# Patient Record
Sex: Male | Born: 1965 | Race: White | Hispanic: No | Marital: Married | Smoking: Former smoker
Health system: Southern US, Community
[De-identification: ages and names within clinical notes are randomized; demographics above are authoritative.]

## PROBLEM LIST (undated history)

## (undated) DIAGNOSIS — G709 Myoneural disorder, unspecified: Secondary | ICD-10-CM

## (undated) DIAGNOSIS — K760 Fatty (change of) liver, not elsewhere classified: Secondary | ICD-10-CM

## (undated) DIAGNOSIS — M109 Gout, unspecified: Secondary | ICD-10-CM

## (undated) DIAGNOSIS — E291 Testicular hypofunction: Secondary | ICD-10-CM

## (undated) DIAGNOSIS — E669 Obesity, unspecified: Secondary | ICD-10-CM

## (undated) DIAGNOSIS — E781 Pure hyperglyceridemia: Secondary | ICD-10-CM

## (undated) HISTORY — DX: Gout, unspecified: M10.9

## (undated) HISTORY — DX: Obesity, unspecified: E66.9

## (undated) HISTORY — PX: SPINE SURGERY: SHX786

## (undated) HISTORY — PX: OTHER SURGICAL HISTORY: SHX169

## (undated) HISTORY — DX: Fatty (change of) liver, not elsewhere classified: K76.0

## (undated) HISTORY — DX: Pure hyperglyceridemia: E78.1

## (undated) HISTORY — DX: Testicular hypofunction: E29.1

## (undated) HISTORY — DX: Myoneural disorder, unspecified: G70.9

---

## 2003-01-19 ENCOUNTER — Encounter: Payer: Self-pay | Admitting: Emergency Medicine

## 2003-01-19 ENCOUNTER — Emergency Department (HOSPITAL_COMMUNITY): Admission: EM | Admit: 2003-01-19 | Discharge: 2003-01-19 | Payer: Self-pay | Admitting: Emergency Medicine

## 2003-04-16 ENCOUNTER — Ambulatory Visit (HOSPITAL_COMMUNITY): Admission: RE | Admit: 2003-04-16 | Discharge: 2003-04-16 | Payer: Self-pay | Admitting: Neurosurgery

## 2003-05-09 ENCOUNTER — Ambulatory Visit (HOSPITAL_COMMUNITY): Admission: RE | Admit: 2003-05-09 | Discharge: 2003-05-10 | Payer: Self-pay | Admitting: Neurosurgery

## 2003-05-09 ENCOUNTER — Encounter: Payer: Self-pay | Admitting: Neurosurgery

## 2003-12-18 ENCOUNTER — Encounter (HOSPITAL_COMMUNITY): Admission: RE | Admit: 2003-12-18 | Discharge: 2004-01-17 | Payer: Self-pay | Admitting: Neurosurgery

## 2005-07-26 ENCOUNTER — Ambulatory Visit (HOSPITAL_COMMUNITY): Admission: RE | Admit: 2005-07-26 | Discharge: 2005-07-26 | Payer: Self-pay | Admitting: Gastroenterology

## 2005-07-26 ENCOUNTER — Encounter (INDEPENDENT_AMBULATORY_CARE_PROVIDER_SITE_OTHER): Payer: Self-pay | Admitting: *Deleted

## 2005-11-04 ENCOUNTER — Encounter: Admission: RE | Admit: 2005-11-04 | Discharge: 2005-11-04 | Payer: Self-pay | Admitting: Orthopedic Surgery

## 2007-11-12 ENCOUNTER — Inpatient Hospital Stay (HOSPITAL_COMMUNITY): Admission: RE | Admit: 2007-11-12 | Discharge: 2007-11-14 | Payer: Self-pay | Admitting: Neurosurgery

## 2009-05-23 IMAGING — CR DG CERVICAL SPINE 2 OR 3 VIEWS
1 series · 1 of 1 positions shown · non-contrast
Comparison: none

CLINICAL DATA: 41-year-old with numbness in left arm.
 CERVICAL SPINE - 2 VIEW:

[view not recorded]
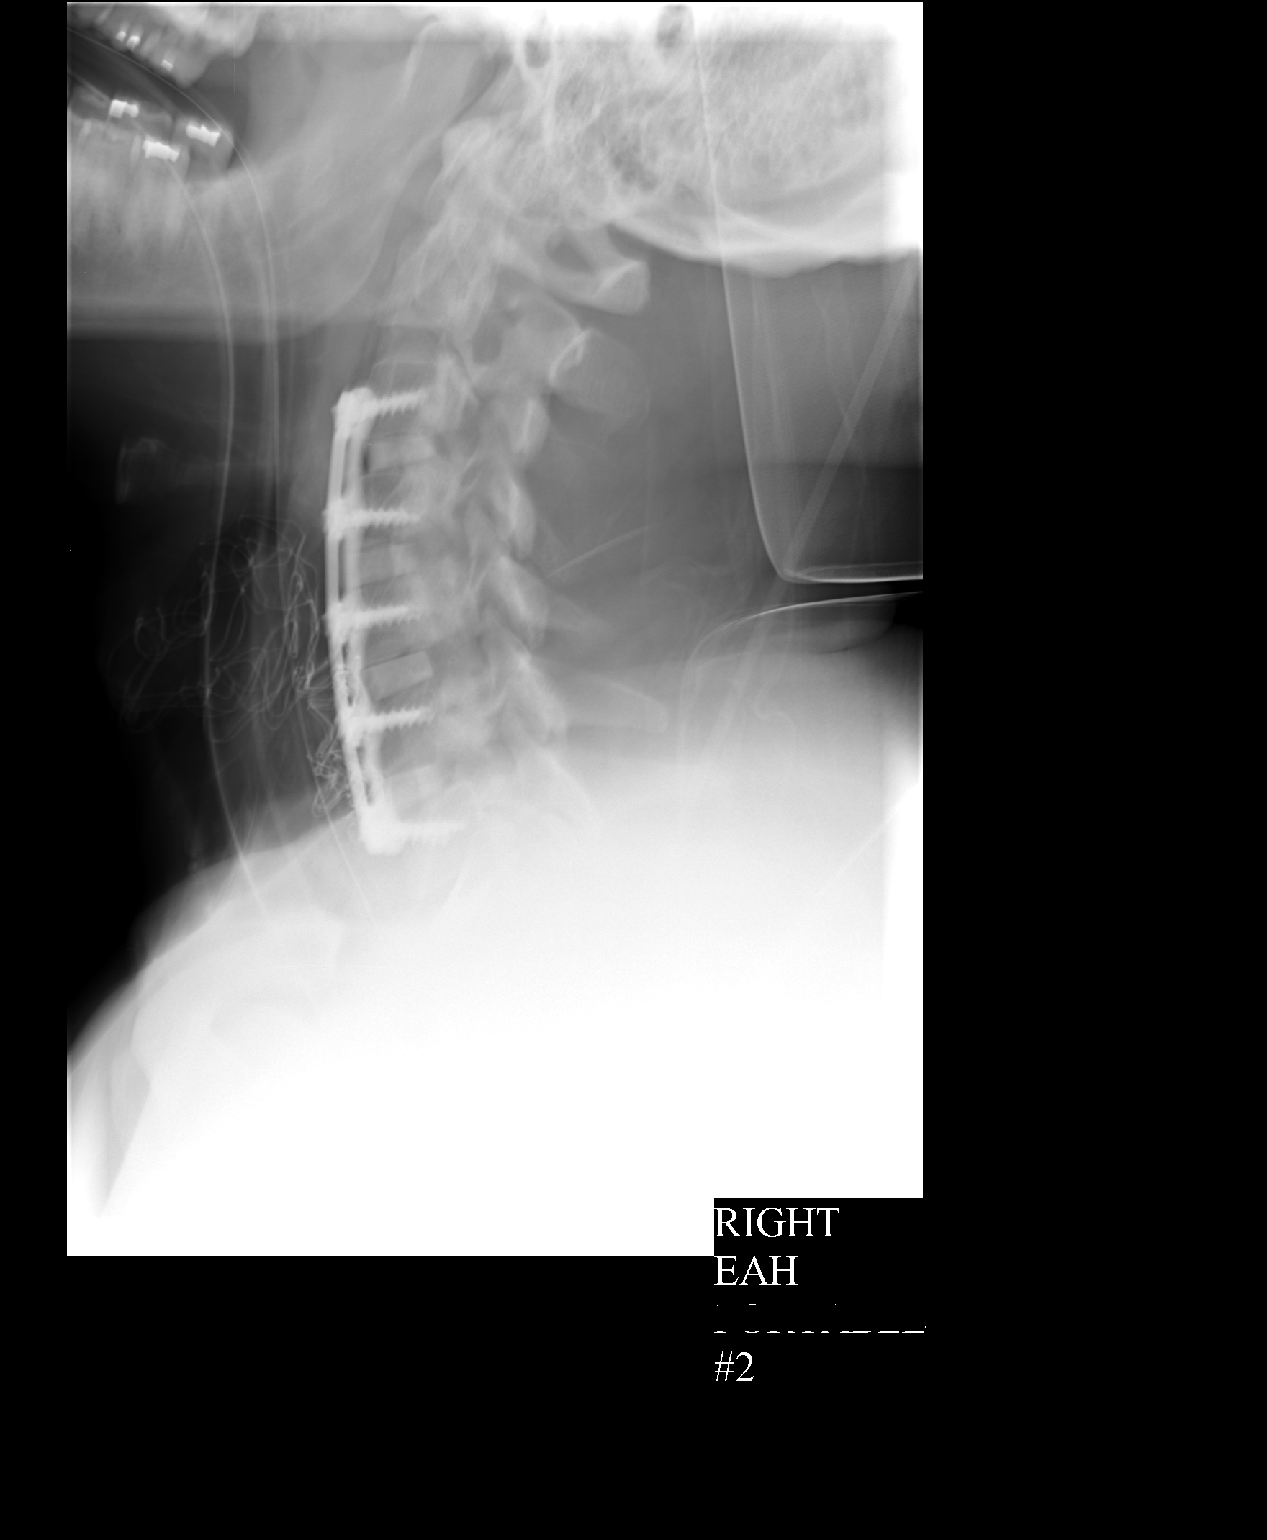

[1 of 1 positions shown; findings below may reference images not displayed]

FINDINGS: Two lateral cervical spine films from the operating room. The first film demonstrates a spinal needle projecting at the C4-5 interspace. The second film demonstrates anterior plate and screws and interbody bone plug fusing C3 to C7. Normal alignment. No complicating features.
IMPRESSION: C3-C7 anterior and interbody fusion.

## 2009-05-23 IMAGING — CR DG CERVICAL SPINE 2 OR 3 VIEWS
1 series · 1 of 1 positions shown · non-contrast
Comparison: none

CLINICAL DATA: 41-year-old with numbness in left arm.
 CERVICAL SPINE - 2 VIEW:

[view not recorded]
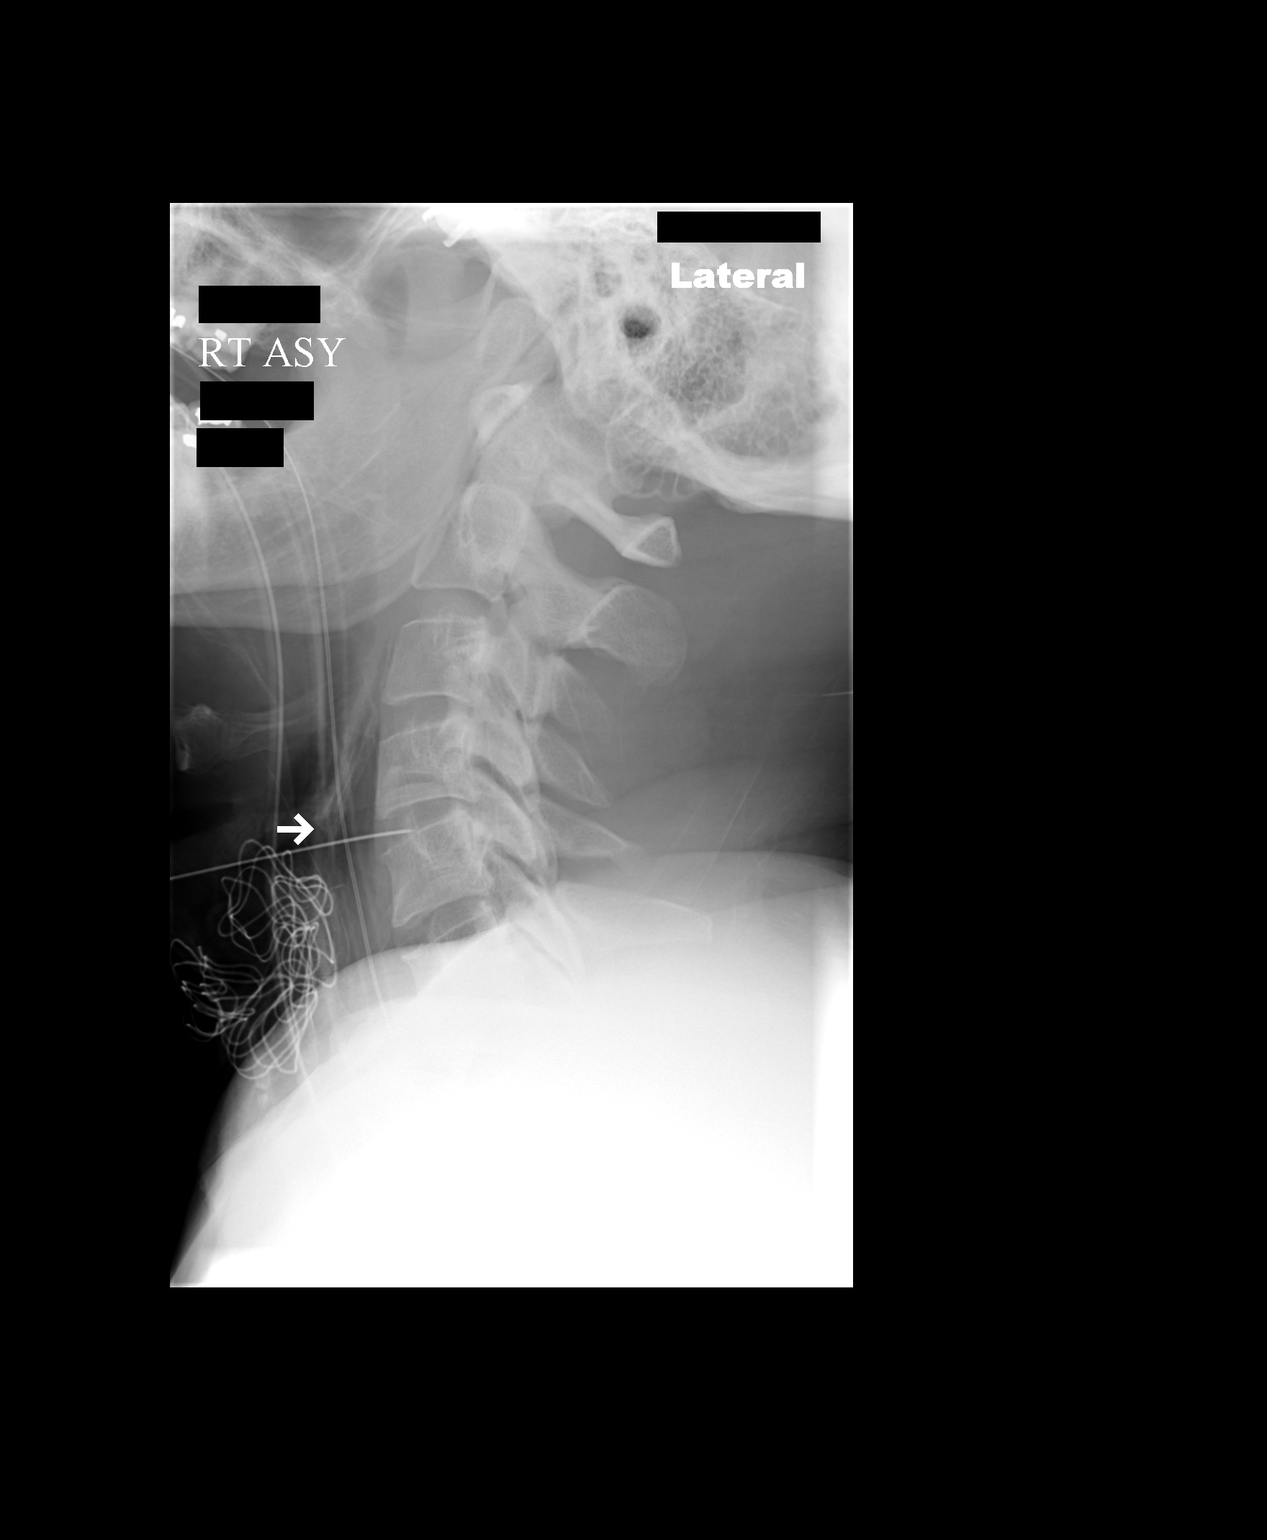

[1 of 1 positions shown; findings below may reference images not displayed]

FINDINGS: Two lateral cervical spine films from the operating room. The first film demonstrates a spinal needle projecting at the C4-5 interspace. The second film demonstrates anterior plate and screws and interbody bone plug fusing C3 to C7. Normal alignment. No complicating features.
IMPRESSION: C3-C7 anterior and interbody fusion.

## 2011-02-22 NOTE — Consult Note (Signed)
Victor Murray, SINDELAR NO.:  [id number]   MEDICAL RECORD NO.:  [id number]          PATIENT TYPE:  AMB   LOCATION:  SDS                          FACILITY:  [facility]   PHYSICIAN:  Hilda Lias, M.D.   DATE OF BIRTH:  1966-09-23   DATE OF CONSULTATION:  11/12/2007  DATE OF DISCHARGE:                                 CONSULTATION   HISTORY OF PRESENT ILLNESS:  Victor Murray is a gentleman who was in my  office last week as emergency because of weakness of both upper  extremity associated with a burning pain.  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  He was seen in __________ [facility].  Because I did surgery on  his back many years ago, he called me and I saw him in my office late  last week.  Including clinically, he has a burning sensation in both  upper extremity with weakness of the deltoid and biceps.  We did an MRI  which showed degenerative disk disease with stenosis and spinal cord  changes, worse at the C4-5, C6-7, but also really compromise at the  level of C3-4 and C6-7.  Because two __________ were really bad and the  other were really abnormal, he wanted to go ahead and do mostly the  decompression of the level of C4-5, C5-6, thinking that he can go back  to fighting in [place] in 2 weeks.  I was skeptical about all of  this, and he said that over the weekend he talked to his company, and I  think that they will not take him after having neck effusion.  Because  of that, I talked to him at length today.  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  Nevertheless, that is not going to be  possible.  Because of that, we change the strategy, and he wanted to  proceed with the following decompression which in deed that is what  eventually he will be needing.  Then the procedure today was changes to  C3-4, C4-5,  C5-6, C6-7 decompression with fusion.  The risk of  __________ was fully explained to him, and they do not change it  whatsoever in relation to the one we were doing before.           ______________________________  Hilda Lias, M.D.     EB/MEDQ  D:  11/12/2007  T:  11/12/2007  Job:  [id number]

## 2011-02-22 NOTE — Op Note (Signed)
Victor Murray, POKE NO.:  [id number]   MEDICAL RECORD NO.:  [id number]          PATIENT TYPE:  OIB   LOCATION:  3172                         FACILITY:  [facility]   PHYSICIAN:  Hilda Lias, M.D.   DATE OF BIRTH:  04/24/1966   DATE OF PROCEDURE:  11/12/2007  DATE OF DISCHARGE:                               OPERATIVE REPORT   PREOPERATIVE DIAGNOSES:  1. Cervical stenosis with cervical myelopathy.  2. 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   POSTOPERATIVE DIAGNOSES:  1. Cervical stenosis with cervical myelopathy.  2. 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   PROCEDURE:  1. Anterior C3-4, C4-5, C5-6 and C6-7 diskectomy.  2. Decompression of the spinal cord.  3. Interbody fusion with allograft and autograft plate from V3-X1,      microscope.   </   SURGEON:  Hilda Lias, M.D.   ASSISTANT:  Cristi Loron, M.D.   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  He was in the battlefield constantly.  He  came to [facility] for vacation, and he was complaining of some burning  sensation at the level of the left anterior chest wall.  In the past I  did surgery in the lumbar area on this gentleman, and I saw him in my  office as an emergency.  We did an MRI, which showed severe spondylosis  from C3 down to C7.  The worst was at the level of C4-5 and C5-6, where  he already had some changes within the spinal cord itself.  Because the  patient wanted to go back to [place] around November 30, 2007, we  agreed to proceed with the two levels of C4-5 and C5-6 (which were the  worst ones); although he knew that the disks above and below might  require surgery later on because of the compromise.  Nevertheless, the  stenosis was worse at those two levels.   This morning, upon talking to him and after he reconsidered with his  wife; they decided that he does not want  to take any chances, and we are  going to procedure with all levels.  The surgery was fully explained to  him in my office, including the possibility of paralysis, failure of the  plate, need for further surgery, infection with stroke, and no  improvement whatsoever.   PROCEDURE:  The patient was taken to the OR and after intubation, which  was done in a neutral position, the neck was cleaned with DuraPrep.  A  longitudinal incision was made through the skin and subcutaneous tissue.  X-rays showed that we were at the level of C4-5.  Because C4-5 was the  worst one, we started at this level.  There were osteophytes anteriorly,  which were removed.  With the microscope we opened the anterior ligament  of C4-5, and we did remove the disk which was quite degenerative.  We  opened the posterior ligament and there was some herniated disk going  bilaterally.  The spinal cord was  flat.  With the 1 mm, 2 and 3 mm  Kerrison punch we removed the posterior ligament, with decompression of  the spinal cord as well as foraminotomy.   Then, the next level was C5/6, which was the worst one.  Here is where  we found that there was adhesion between posterior ligament and the dura  matter.  We had to use a microdissector to be able to find a plane, and  at the end we were able to decompress the C6 nerve root and the spinal  cord.  At the level of C6-7 we found a central disk with bilateral  compromise, the right worse than the left one, up to the point that  there was quite a bit adhesion between the foramen and the C7 nerve root  on the right side.  At the level of C3-C4, we found mostly spondylosis  with bilateral compromise, right worse than the left.  Then we went  ahead and drilled the endplate.  Then full bone graft 1-6 mm with  allograft with bone autograft in the middle was inserted.  This was  between C3 and C4, and the rest of 7 mm height was between C4-5, C5-6  and C6-7.  Then a plate using  titanium screws was applied to keep the  area stable.  Lateral cervical spine showed good position of the plate  and the bone graft.  Although we had good hemostasis, because of the  magnitude of the surgery we left the catheter in the paracervical area.  Then the wound was closed with Vicryl and Steri-Strips.           ______________________________  Hilda Lias, M.D.     EB/MEDQ  D:  11/12/2007  T:  11/12/2007  Job:  [id number]

## 2011-02-22 NOTE — Discharge Summary (Signed)
NAMEKYCE, GING NO.:  [id number]   MEDICAL RECORD NO.:  [id number]          PATIENT TYPE:  INP   LOCATION:  3006                         FACILITY:  [facility]   PHYSICIAN:  Hilda Lias, M.D.   DATE OF BIRTH:  [birth year]   DATE OF ADMISSION:  11/12/2007  DATE OF DISCHARGE:  11/14/2007                               DISCHARGE SUMMARY   ADMISSION DIAGNOSIS:  Cervical stenosis with early myelopathy C3 to C7.   DISCHARGE DIAGNOSIS:  Cervical stenosis with early myelopathy C3 to C7.   CLINICAL HISTORY:  The patient was admitted because of neck pain with  weakness, numbness, and tingling sensation in both hands.  X-ray showed  severe stenosis from C3 down to C7.  Surgery was advised.  Laboratory  normal.   The patient was taken to surgery and anterior decompression from C3 down  to C7 with a fusion using bone graft and plate was done.  Today he is  doing much better.  He is walking.  He has no discomfort whatsoever.  He  feels that the numbness is much better.  He is doing so well up to the  point that he left the hospital to smoke.  He has no difficulty  swallowing.  He is going to be discharged.   CONDITION ON DISCHARGE:  Improved.   DISCHARGE MEDICATIONS:  1. Percocet.  2. Diazepam.   DIET:  Regular.  He was advised to avoid big pieces of meat.   ACTIVITY:  No driving for at least a week.   FOLLOWUP:  He will be seen by me in 3-4 weeks.  Any questions or  swelling, he is to call my office any time.           ______________________________  Hilda Lias, M.D.     EB/MEDQ  D:  11/14/2007  T:  11/15/2007  Job:  [id number]

## 2011-02-25 NOTE — Op Note (Signed)
Victor Murray, Victor Murray                         ACCOUNT NO.:  [id number]   MEDICAL RECORD NO.:  [id number]                   PATIENT TYPE:  OIB   LOCATION:  3013                                 FACILITY:  [facility]   PHYSICIAN:  Hilda Lias, M.D.                DATE OF BIRTH:  1966-03-11   DATE OF PROCEDURE:  05/09/2003  DATE OF DISCHARGE:                                 OPERATIVE REPORT   PREOPERATIVE DIAGNOSIS:  Right L3-4 herniated disk.   POSTOPERATIVE DIAGNOSIS:  Right L3-4 herniated disk.   PROCEDURE:  Right L3-4 diskectomy, decompression of the L3 nerve root,  microscope.   SURGEON:  Hilda Lias, M.D.   ASSISTANT:  Coletta Memos, M.D.   CLINICAL HISTORY:  The patient was admitted because of back and left leg  pain.  The patient had weakness of the quadriceps and the iliopsoas.  The  patient was scheduled for surgery a few weeks ago, but he decided that he  was doing better and he declined surgery.  Then he came back later on to my  office with worsening of the pain.  Surgery was advised all over again, and  he decided that indeed that was the best way for him.  He just bought a new  house, and he was advised about being careful about lifting because he might  have a recurrence.  The risks were explained in the history and physical.   DESCRIPTION OF PROCEDURE:  The patient was taken to the OR, and he was  positioned in a prone manner.  The back was prepped with Betadine.  A  midline incision from L3 to L4 was made.  X-rays showed that indeed we were  at that area.  We brought the microscope into the area, and we drilled the  lower lamina of L3 and the upper of L4.  A thick yellow ligament was also  excised.  We identified the thecal sac.  Indeed, there was quite a bit of  tightness in that area.  Retraction was made, and the L4 nerve root was  anchored to the space.  Lysis was achieved.  Indeed, there was also ligament  and disk going into the body of L3.  Removal was  made.  There was some  calcification of that area.  We entered the disk space and large amounts of  degenerated disk medial and lateral were removed.  At the end we had plenty  of room with plenty of decompression for the L3 and L4 nerve root.  Valsalva  maneuver was negative.  From then on the area was irrigated.  Fentanyl and  Depo-Medrol were left in the epidural space, and the wound was closed with  Vicryl and a Steri-Strip.  Hilda Lias, M.D.    EB/MEDQ  D:  05/09/2003  T:  05/09/2003  Job:  [id number]

## 2011-02-25 NOTE — Discharge Summary (Signed)
   NAME:  Victor Murray, Victor Murray                         ACCOUNT NO.:  [id number]   MEDICAL RECORD NO.:  [id number]                   PATIENT TYPE:  OIB   LOCATION:  2853                                 FACILITY:  [facility]   PHYSICIAN:  Hilda Lias, M.D.                DATE OF BIRTH:  Dec 07, 1965   DATE OF ADMISSION:  04/16/2003  DATE OF DISCHARGE:  04/16/2003                                 DISCHARGE SUMMARY   SUMMARY:  [name] is a 45 year old gentleman who was admitted today  because of back and left leg pain.  MRI showed that he has a huge herniated  disk.  Surgery was advised because of no improvement.  The patient, when he  came to see me half an hour before surgery, was telling me that he was  feeling better.  I advised him that if he is feeling better we should not  start surgery because we do not like to do surgery on people who are better.  I explained to him that we do surgery on people who are getting worse,  unless they have a tumor.  I talked to him and his wife.  The patient  debated between going ahead and not going ahead, but again I told him that  one of the principals of surgery is the mental preparation for the person to  go ahead with surgery.  I do think that he has quite doubts about indeed  requiring surgery and I told him that it would be better for him to go home,  see how he feels, become more active, and if indeed the pain gets worse to  give me a call; I would be more than happy to reschedule him.                                               Hilda Lias, M.D.    EB/MEDQ  D:  04/16/2003  T:  04/17/2003  Job:  [id number]

## 2011-02-25 NOTE — H&P (Signed)
NAME:  Victor Murray, Victor Murray                         ACCOUNT NO.:  [id number]   MEDICAL RECORD NO.:  [id number]                   PATIENT TYPE:  OIB   LOCATION:  NA                                   FACILITY:  [facility]   PHYSICIAN:  Hilda Lias, M.D.                DATE OF BIRTH:  07-08-1966   DATE OF ADMISSION:  05/09/2003  DATE OF DISCHARGE:                                HISTORY & PHYSICAL   HISTORY OF PRESENT ILLNESS:  This patient is a gentleman who was seen by me  in my office about two weeks ago complaining of back pain with radiation  down to the left knee mostly going to the left thigh.  The patient was  cleared to have surgery a few days ago but he decided to cancel because he  felt he was doing better.  Nevertheless, he came back soon after to my  office telling me that he was not any better, he was getting worse with more  pain down into the left leg.  He was quite miserable.  Now he has decided he  wants to go on with the surgery because he is out of work and notes that his  pain is getting worse.  He denies any problems with his right leg.   PAST MEDICAL HISTORY:  Prior knee arthroscopy.   ALLERGIES:  The patient is not allergic to any medications.   SOCIAL HISTORY:  The patient smokes and drinks.   FAMILY HISTORY:  Unremarkable.   REVIEW OF SYMPTOMS:  Positive for back pain and radiation down into the left  leg.   PHYSICAL EXAMINATION:  HEENT:  Head, nose and throat normal.  NECK:  Normal.  LUNGS:  Clear.  HEART:  Sounds are normal.  ABDOMEN:  Normal.  EXTREMITIES:  Normal pulses.  NEUROLOGICAL:  Normal.  Cranial nerves II-XII normal.  Strength is 5/5 on  examination but __________ the left iliopsoas and left abductor.  Reflexes  are symmetrical.  Absent of both knee jerks but present at ankle.  His  straight leg raising is negative.  The femoral stretch maneuver is positive  on the left side.   CLINICAL DATA:  The MRI shows that he has a herniated disc at level  L3-L4  with a fragment going up to the __________ vertebrae.  There is a small  bulging disc at L4-L5.   IMPRESSION:  Left L3-L4 herniated disc with fragment.   PLAN:  The patient is going to be admitted for surgery.  The procedure will  be left L3-L4 diskectomy.  At the beginning we were going to do procedure  with the C-arm__________, but because of the condition and we worry  about the fibrosis around the nerve root, we are going to a standard surgery  using the microscope.  This surgery was explained to him and his wife in my  office with the risks such as  no improvement whatsoever, worsening of pain,  paralysis, CSF leak, need for further surgery.                                                Hilda Lias, M.D.    EB/MEDQ  D:  05/09/2003  T:  05/09/2003  Job:  [id number]

## 2011-07-01 LAB — CBC
HCT: 45.5
Hemoglobin: 15.9
MCHC: 34.9
MCV: 93.1
RBC: 4.88
RDW: 13.1

## 2015-06-08 ENCOUNTER — Ambulatory Visit (INDEPENDENT_AMBULATORY_CARE_PROVIDER_SITE_OTHER): Payer: No Typology Code available for payment source | Admitting: Family Medicine

## 2015-06-08 ENCOUNTER — Encounter: Payer: Self-pay | Admitting: Family Medicine

## 2015-06-08 VITALS — BP 104/68 | HR 72 | Temp 98.2°F | Resp 18 | Ht 70.5 in | Wt 237.0 lb

## 2015-06-08 DIAGNOSIS — K760 Fatty (change of) liver, not elsewhere classified: Secondary | ICD-10-CM

## 2015-06-08 DIAGNOSIS — E781 Pure hyperglyceridemia: Secondary | ICD-10-CM | POA: Insufficient documentation

## 2015-06-08 DIAGNOSIS — Z7189 Other specified counseling: Secondary | ICD-10-CM | POA: Diagnosis not present

## 2015-06-08 DIAGNOSIS — E785 Hyperlipidemia, unspecified: Secondary | ICD-10-CM

## 2015-06-08 DIAGNOSIS — Z7689 Persons encountering health services in other specified circumstances: Secondary | ICD-10-CM

## 2015-06-08 DIAGNOSIS — E291 Testicular hypofunction: Secondary | ICD-10-CM | POA: Insufficient documentation

## 2015-06-08 DIAGNOSIS — E669 Obesity, unspecified: Secondary | ICD-10-CM | POA: Insufficient documentation

## 2015-06-08 DIAGNOSIS — M1000 Idiopathic gout, unspecified site: Secondary | ICD-10-CM

## 2015-06-08 DIAGNOSIS — M109 Gout, unspecified: Secondary | ICD-10-CM | POA: Insufficient documentation

## 2015-06-08 NOTE — Progress Notes (Signed)
Subjective:    Patient ID: Victor Murray, male    DOB: 09/27/1966, 49 y.o.   MRN: [id number]  HPI   patient is here today to establish care. He was injured in 2009 while serving in [place]. He injured his neck. He underwent ACDF of 4 cervical vertebrae by Dr. Jeral Fruit.   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 They have been managed with Gralise and diclofenac.   Patient is still trying to work. He wants to become more active. Recently and lab work he was found to have a testosterone level significantly depressed 203, elevated liver function test with an AST of 51 and an ALT of 115, triglycerides of 572, and HDL of 20. He was told that his uric acid level was 3 times the upper limits of normal. He was started on 600 mg a day of allopurinol. He has not had a uric acid level checked since that time. He also had an ultrasound of his liver obtained given the elevated liver function test which showed an enlarged fatty liver. He is here today to establish care. Past Medical History  Diagnosis Date  . Hypertriglyceridemia   . Hypogonadism in male   . Fatty liver disease, nonalcoholic   . Obesity    Past Surgical History  Procedure Laterality Date  . Cervical fusion      Dr. Jeral Fruit    Current outpatient prescriptions:  .  allopurinol (ZYLOPRIM) 300 MG tablet, TK 1 T PO BID, Disp: , Rfl: 11 .  colchicine 0.6 MG tablet, TK 1 T PO Q 2 H PRF SEVERE GOUT ATTACK. MAX OF 6 TS PER DAY, Disp: , Rfl: 0 .  diazepam (VALIUM) 10 MG tablet, TK 1 T PO TID PRN, Disp: , Rfl: 4 .  GRALISE 300 MG TABS, TK 1 T PO QD - Along with 600 mg qhs = 1500 mg qhs, Disp: , Rfl: 4 .  GRALISE 600 MG TABS, TK 2 TS PO QHS - Along with 1 300 mg tanblet =1500mg , Disp: , Rfl: 4 .  magnesium 30 MG tablet, Take 30 mg by mouth 2 (two) times daily., Disp: , Rfl:  .  Potassium Gluconate 550 MG TABS, Take by mouth., Disp: , Rfl:  .  Vitamin D, Ergocalciferol,  (DRISDOL) 50000 UNITS CAPS capsule, TK 1 C PO 2 TIMES A WEEK, Disp: , Rfl: 3 .  ZORVOLEX 35 MG CAPS, TK 1 C PO TID, Disp: , Rfl: 4 No Known Allergies Social History   Social History  . Marital Status: Legally Separated    Spouse Name: N/A  . Number of Children: N/A  . Years of Education: N/A   Occupational History  . Not on file.   Social History Main Topics  . Smoking status: Former Smoker    Quit date: 10/10/2010  . Smokeless tobacco: Not on file  . Alcohol Use: No  . Drug Use: No  . Sexual Activity: Not on file   Other Topics Concern  . Not on file   Social History Narrative  . No narrative on file   Family History  Problem Relation Age of Onset  . Cancer Mother 5    unknown primary  . Diabetes Father       Review of Systems  All other systems reviewed and are negative.      Objective:   Physical Exam  Constitutional: He is oriented to person, place,  and time. He appears well-developed and well-nourished. No distress.  HENT:  Head: Normocephalic and atraumatic.  Right Ear: External ear normal.  Left Ear: External ear normal.  Nose: Nose normal.  Mouth/Throat: Oropharynx is clear and moist. No oropharyngeal exudate.  Eyes: Conjunctivae and EOM are normal. Pupils are equal, round, and reactive to light. Right eye exhibits no discharge. Left eye exhibits no discharge. No scleral icterus.  Neck: Normal range of motion. Neck supple. No JVD present. No thyromegaly present.  Cardiovascular: Normal rate, regular rhythm and normal heart sounds.   No murmur heard. Pulmonary/Chest: Effort normal and breath sounds normal. No respiratory distress. He has no wheezes. He has no rales. He exhibits no tenderness.  Abdominal: Soft. Bowel sounds are normal. He exhibits no distension. There is no tenderness. There is no rebound and no guarding.  Musculoskeletal: He exhibits no edema.  Lymphadenopathy:    He has no cervical adenopathy.  Neurological: He is alert and  oriented to person, place, and time. He has normal reflexes. He displays normal reflexes. No cranial nerve deficit. He exhibits normal muscle tone. Coordination normal.  Skin: Skin is warm. No rash noted. He is not diaphoretic. No erythema. No pallor.  Psychiatric: He has a normal mood and affect. His behavior is normal. Judgment and thought content normal.  Vitals reviewed.         Assessment & Plan:  Establishing care with new doctor, encounter for   I would like the patient to return fasting for a CBC, CMP, uric acid level, fasting lipid panel , and a PSA. His testosterone is being treated by his pain management doctor. He will be due for repeat digital rectal exam and PSA in December. If his uric acid level is not less than 6, I would recommend switching him off the allopurinol due to the risk of liver toxicity and switching him to uloric.   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 Also believe he has fatty liver disease secondary to his dyslipidemia. If this is confirmed on a fasting lipid panel as his other lipid panel was not fasting, I will start the patient on fenofibrate 160 mg by mouth daily. He is not due for colonoscopy until age 46

## 2015-06-09 ENCOUNTER — Ambulatory Visit: Payer: Self-pay | Admitting: Adult Health

## 2015-06-11 ENCOUNTER — Encounter: Payer: Self-pay | Admitting: *Deleted

## 2015-06-12 ENCOUNTER — Other Ambulatory Visit: Payer: No Typology Code available for payment source

## 2015-06-12 LAB — COMPLETE METABOLIC PANEL WITH GFR
ALBUMIN: 4.2 g/dL (ref 3.6–5.1)
ALT: 67 U/L — AB (ref 9–46)
AST: 40 U/L (ref 10–40)
Alkaline Phosphatase: 79 U/L (ref 40–115)
BUN: 18 mg/dL (ref 7–25)
CALCIUM: 9.5 mg/dL (ref 8.6–10.3)
CHLORIDE: 105 mmol/L (ref 98–110)
CO2: 27 mmol/L (ref 20–31)
CREATININE: 0.98 mg/dL (ref 0.60–1.35)
GFR, Est African American: >89 mL/min (ref >=60)
GFR, Est Non African American: >89 mL/min (ref >=60)
GLUCOSE: 96 mg/dL (ref 70–99)
POTASSIUM: 4.6 mmol/L (ref 3.5–5.3)
SODIUM: 139 mmol/L (ref 135–146)
Total Bilirubin: 0.5 mg/dL (ref 0.2–1.2)
Total Protein: 6.8 g/dL (ref 6.1–8.1)

## 2015-06-12 LAB — LIPID PANEL
CHOLESTEROL: 145 mg/dL (ref 125–200)
HDL: 22 mg/dL — ABNORMAL LOW (ref >=40)
LDL Cholesterol: 79 mg/dL (ref <130)
Total CHOL/HDL Ratio: 6.6 Ratio — ABNORMAL HIGH (ref <=5.0)
Triglycerides: 222 mg/dL — ABNORMAL HIGH (ref <150)
VLDL: 44 mg/dL — ABNORMAL HIGH (ref <30)

## 2015-06-12 LAB — CBC WITH DIFFERENTIAL/PLATELET
Basophils Absolute: 0.1 10*3/uL (ref 0.0–0.1)
Basophils Relative: 1 % (ref 0–1)
EOS PCT: 4 % (ref 0–5)
Eosinophils Absolute: 0.2 10*3/uL (ref 0.0–0.7)
HEMATOCRIT: 41.5 % (ref 39.0–52.0)
HEMOGLOBIN: 14.1 g/dL (ref 13.0–17.0)
LYMPHS PCT: 51 % — AB (ref 12–46)
Lymphs Abs: 3 10*3/uL (ref 0.7–4.0)
MCH: 30.1 pg (ref 26.0–34.0)
MCHC: 34 g/dL (ref 30.0–36.0)
MCV: 88.7 fL (ref 78.0–100.0)
MONO ABS: 0.5 10*3/uL (ref 0.1–1.0)
MONOS PCT: 8 % (ref 3–12)
MPV: 10.5 fL (ref 8.6–12.4)
NEUTROS ABS: 2.1 10*3/uL (ref 1.7–7.7)
Neutrophils Relative %: 36 % — ABNORMAL LOW (ref 43–77)
Platelets: 257 10*3/uL (ref 150–400)
RBC: 4.68 MIL/uL (ref 4.22–5.81)
RDW: 16.3 % — AB (ref 11.5–15.5)
WBC: 5.9 10*3/uL (ref 4.0–10.5)

## 2015-06-12 LAB — URIC ACID: URIC ACID, SERUM: 4.1 mg/dL (ref 4.0–7.8)

## 2015-06-13 LAB — PSA: PSA: 0.65 ng/mL (ref <=4.00)

## 2015-06-18 ENCOUNTER — Other Ambulatory Visit: Payer: Self-pay | Admitting: Family Medicine

## 2015-06-18 MED ORDER — FENOFIBRATE 160 MG PO TABS: 160.0000 mg | ORAL_TABLET | Freq: Every day | ORAL | Status: DC

## 2015-09-18 ENCOUNTER — Ambulatory Visit (INDEPENDENT_AMBULATORY_CARE_PROVIDER_SITE_OTHER): Payer: No Typology Code available for payment source | Admitting: Family Medicine

## 2015-09-18 ENCOUNTER — Encounter: Payer: Self-pay | Admitting: Family Medicine

## 2015-09-18 VITALS — BP 130/78 | HR 80 | Temp 98.4°F | Resp 20 | Ht 70.5 in | Wt 244.0 lb

## 2015-09-18 DIAGNOSIS — E785 Hyperlipidemia, unspecified: Secondary | ICD-10-CM | POA: Diagnosis not present

## 2015-09-18 LAB — LIPID PANEL
CHOL/HDL RATIO: 6.7 ratio — AB (ref <=5.0)
CHOLESTEROL: 174 mg/dL (ref 125–200)
HDL: 26 mg/dL — AB (ref >=40)
LDL Cholesterol: 111 mg/dL (ref <130)
Triglycerides: 184 mg/dL — ABNORMAL HIGH (ref <150)
VLDL: 37 mg/dL — AB (ref <30)

## 2015-09-18 LAB — COMPLETE METABOLIC PANEL WITH GFR
ALK PHOS: 63 U/L (ref 40–115)
ALT: 34 U/L (ref 9–46)
AST: 27 U/L (ref 10–40)
Albumin: 4.2 g/dL (ref 3.6–5.1)
BUN: 21 mg/dL (ref 7–25)
CHLORIDE: 100 mmol/L (ref 98–110)
CO2: 25 mmol/L (ref 20–31)
Calcium: 9.4 mg/dL (ref 8.6–10.3)
Creat: 1.19 mg/dL (ref 0.60–1.35)
GFR, EST AFRICAN AMERICAN: 82 mL/min (ref >=60)
GFR, Est Non African American: 71 mL/min (ref >=60)
GLUCOSE: 96 mg/dL (ref 70–99)
Potassium: 4.6 mmol/L (ref 3.5–5.3)
SODIUM: 137 mmol/L (ref 135–146)
Total Bilirubin: 0.6 mg/dL (ref 0.2–1.2)
Total Protein: 7.4 g/dL (ref 6.1–8.1)

## 2015-09-18 NOTE — Progress Notes (Signed)
Subjective:    Patient ID: Victor Murray, male    DOB: 10/02/1966, 49 y.o.   MRN: [id number]  HPI  06/08/15  patient is here today to establish care. He was injured in 2009 while serving in [place]. He injured his neck. He underwent ACDF of 4 cervical vertebrae by Dr. Jeral Fruit.   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 They have been managed with Gralise and diclofenac.   Patient is still trying to work. He wants to become more active. Recently and lab work he was found to have a testosterone level significantly depressed 203, elevated liver function test with an AST of 51 and an ALT of 115, triglycerides of 572, and HDL of 20. He was told that his uric acid level was 3 times the upper limits of normal. He was started on 600 mg a day of allopurinol. He has not had a uric acid level checked since that time. He also had an ultrasound of his liver obtained given the elevated liver function test which showed an enlarged fatty liver. He is here today to establish care.  At that time, my plan was: I would like the patient to return fasting for a CBC, CMP, uric acid level, fasting lipid panel , and a PSA. His testosterone is being treated by his pain management doctor. He will be due for repeat digital rectal exam and PSA in December. If his uric acid level is not less than 6, I would recommend switching him off the allopurinol due to the risk of liver toxicity and switching him to uloric.   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 Also believe he has fatty liver disease secondary to his dyslipidemia. If this is confirmed on a fasting lipid panel as his other lipid panel was not fasting, I will start the patient on fenofibrate 160 mg by mouth daily. He is not due for colonoscopy until age 54  09/18/15 Patient is here today for follow-up of his dyslipidemia. He is currently taking fenofibrate 160 mg by mouth daily. He  denies any right upper quadrant pain. He denies any new myalgias although he does have significant body pain secondary to his service related injuries. He also has an underlying history of fatty liver disease. He is here today to monitor a CMP to watch for worsening of his fatty liver disease, to evaluate for any signs of liver irritation secondary to the fenofibrate, and also monitor his dyslipidemia. He does report pain in his feet. It is worse in the right foot although it is also present in the left foot. He describes it as a generalized ache. The pain seems to be located at approximately the fourth MTP joint primarily on the plantar aspect of both feet. There are no palpable deformities in this area. There is no swelling although he is tender to palpation in that area. His uric acid level was excellent at his last blood work at 4.1 and he is in steady state and therefore I do not believe this is likely gout. I would be concerned about possible arthritis as a potential cause. He is already taking diclofenac 35 mg by mouth 3 times a day Past Medical History  Diagnosis Date  . Hypertriglyceridemia   . Hypogonadism in male   . Fatty liver disease, nonalcoholic   . Obesity   . Gout   . Neuromuscular disorder (HCC)  Past Surgical History  Procedure Laterality Date  . Cervical fusion      Dr. Jeral Fruit  . Spine surgery      Current outpatient prescriptions:  .  allopurinol (ZYLOPRIM) 300 MG tablet, TK 1 T PO BID, Disp: , Rfl: 11 .  colchicine 0.6 MG tablet, TK 1 T PO Q 2 H PRF SEVERE GOUT ATTACK. MAX OF 6 TS PER DAY, Disp: , Rfl: 0 .  diazepam (VALIUM) 10 MG tablet, TK 1 T PO TID PRN, Disp: , Rfl: 4 .  fenofibrate 160 MG tablet, Take 1 tablet (160 mg total) by mouth daily., Disp: 90 tablet, Rfl: 3 .  GRALISE 300 MG TABS, TK 1 T PO QD - Along with 600 mg qhs = 1500 mg qhs, Disp: , Rfl: 4 .  GRALISE 600 MG TABS, TK 2 TS PO QHS - Along with 1 300 mg tanblet =1500mg , Disp: , Rfl: 4 .  magnesium 30  MG tablet, Take 1,200 mg by mouth at bedtime. , Disp: , Rfl:  .  Potassium Gluconate 550 MG TABS, Take by mouth., Disp: , Rfl:  .  Vitamin D, Ergocalciferol, (DRISDOL) 50000 UNITS CAPS capsule, TK 1 C PO 2 TIMES A WEEK, Disp: , Rfl: 3 .  ZORVOLEX 35 MG CAPS, TK 1 C PO TID  (Diclofenac), Disp: , Rfl: 4 No Known Allergies Social History   Social History  . Marital Status: Legally Separated    Spouse Name: N/A  . Number of Children: N/A  . Years of Education: N/A   Occupational History  . Not on file.   Social History Main Topics  . Smoking status: Former Smoker    Quit date: 10/10/2010  . Smokeless tobacco: Never Used  . Alcohol Use: No  . Drug Use: No  . Sexual Activity: Not Currently   Other Topics Concern  . Not on file   Social History Narrative   Family History  Problem Relation Age of Onset  . Cancer Mother 75    unknown primary  . Early death Mother   . Diabetes Father   . Hearing loss Father   . Diabetes Brother   . Alcohol abuse Paternal Grandfather       Review of Systems  All other systems reviewed and are negative.      Objective:   Physical Exam  Constitutional: He is oriented to person, place, and time. He appears well-developed and well-nourished. No distress.  HENT:  Head: Normocephalic and atraumatic.  Right Ear: External ear normal.  Left Ear: External ear normal.  Nose: Nose normal.  Mouth/Throat: Oropharynx is clear and moist. No oropharyngeal exudate.  Eyes: Conjunctivae and EOM are normal. Pupils are equal, round, and reactive to light. Right eye exhibits no discharge. Left eye exhibits no discharge. No scleral icterus.  Neck: Normal range of motion. Neck supple. No JVD present. No thyromegaly present.  Cardiovascular: Normal rate, regular rhythm and normal heart sounds.   No murmur heard. Pulmonary/Chest: Effort normal and breath sounds normal. No respiratory distress. He has no wheezes. He has no rales. He exhibits no tenderness.    Abdominal: Soft. Bowel sounds are normal. He exhibits no distension. There is no tenderness. There is no rebound and no guarding.  Musculoskeletal: He exhibits no edema.  Lymphadenopathy:    He has no cervical adenopathy.  Neurological: He is alert and oriented to person, place, and time. He has normal reflexes. No cranial nerve deficit. He exhibits normal muscle tone. Coordination normal.  Skin: Skin is warm. No rash noted. He is not diaphoretic. No erythema. No pallor.  Psychiatric: He has a normal mood and affect. His behavior is normal. Judgment and thought content normal.  Vitals reviewed.         Assessment & Plan:  Dyslipidemia - Plan: COMPLETE METABOLIC PANEL WITH GFR, Lipid panel check a fasting lipid panel to monitor his triglycerides as well as his HDL cholesterol. I will also check a CMP to monitor his liver function test.  Trigs were previously > 200 and HDl was <30.  Hopefully we will see some improvement since starting fenofibrate. If his liver function tests can tolerate, I will suggest increasing diclofenac to 75 mg by mouth twice a day to treat the pain in his feet which I assume is osteoarthritis. Patient states that he has had x-rays taken at his pain management specialist.

## 2015-09-24 ENCOUNTER — Other Ambulatory Visit: Payer: Self-pay | Admitting: *Deleted

## 2015-09-24 DIAGNOSIS — E785 Hyperlipidemia, unspecified: Secondary | ICD-10-CM

## 2015-09-24 DIAGNOSIS — K760 Fatty (change of) liver, not elsewhere classified: Secondary | ICD-10-CM

## 2015-11-16 ENCOUNTER — Encounter: Payer: Self-pay | Admitting: Family Medicine

## 2015-12-10 ENCOUNTER — Other Ambulatory Visit: Payer: Self-pay

## 2016-01-11 ENCOUNTER — Other Ambulatory Visit: Payer: No Typology Code available for payment source

## 2016-01-11 DIAGNOSIS — K760 Fatty (change of) liver, not elsewhere classified: Secondary | ICD-10-CM

## 2016-01-11 DIAGNOSIS — E785 Hyperlipidemia, unspecified: Secondary | ICD-10-CM

## 2016-01-11 LAB — COMPLETE METABOLIC PANEL WITH GFR
ALT: 50 U/L — AB (ref 9–46)
AST: 32 U/L (ref 10–35)
Albumin: 4.1 g/dL (ref 3.6–5.1)
Alkaline Phosphatase: 61 U/L (ref 40–115)
BILIRUBIN TOTAL: 0.3 mg/dL (ref 0.2–1.2)
BUN: 15 mg/dL (ref 7–25)
CO2: 25 mmol/L (ref 20–31)
CREATININE: 1.15 mg/dL (ref 0.70–1.33)
Calcium: 9.1 mg/dL (ref 8.6–10.3)
Chloride: 106 mmol/L (ref 98–110)
GFR, EST AFRICAN AMERICAN: 85 mL/min (ref >=60)
GFR, Est Non African American: 74 mL/min (ref >=60)
GLUCOSE: 98 mg/dL (ref 70–99)
Potassium: 4.7 mmol/L (ref 3.5–5.3)
Sodium: 139 mmol/L (ref 135–146)
TOTAL PROTEIN: 6.6 g/dL (ref 6.1–8.1)

## 2016-01-11 LAB — LIPID PANEL
Cholesterol: 158 mg/dL (ref 125–200)
HDL: 22 mg/dL — ABNORMAL LOW (ref >=40)
LDL CALC: 64 mg/dL (ref <130)
Total CHOL/HDL Ratio: 7.2 Ratio — ABNORMAL HIGH (ref <=5.0)
Triglycerides: 361 mg/dL — ABNORMAL HIGH (ref <150)
VLDL: 72 mg/dL — ABNORMAL HIGH (ref <30)

## 2016-01-25 ENCOUNTER — Ambulatory Visit: Payer: No Typology Code available for payment source | Admitting: Family Medicine

## 2016-02-01 ENCOUNTER — Ambulatory Visit (INDEPENDENT_AMBULATORY_CARE_PROVIDER_SITE_OTHER): Payer: No Typology Code available for payment source | Admitting: Family Medicine

## 2016-02-01 ENCOUNTER — Encounter: Payer: Self-pay | Admitting: Family Medicine

## 2016-02-01 VITALS — BP 108/70 | HR 76 | Temp 97.6°F | Resp 16 | Ht 70.5 in | Wt 254.0 lb

## 2016-02-01 DIAGNOSIS — E785 Hyperlipidemia, unspecified: Secondary | ICD-10-CM | POA: Diagnosis not present

## 2016-02-01 DIAGNOSIS — K760 Fatty (change of) liver, not elsewhere classified: Secondary | ICD-10-CM

## 2016-02-01 MED ORDER — ALLOPURINOL 300 MG PO TABS: ORAL_TABLET | ORAL | Status: DC

## 2016-02-01 NOTE — Progress Notes (Signed)
Subjective:    Patient ID: Victor Murray, male    DOB: 11/11/1965, 50 y.o.   MRN: [id number]  HPI  06/08/15  patient is here today to establish care. He was injured in 2009 while serving in [place]. He injured his neck. He underwent ACDF of 4 cervical vertebrae by Dr. Jeral Fruit.   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 They have been managed with Gralise and diclofenac.   Patient is still trying to work. He wants to become more active. Recently and lab work he was found to have a testosterone level significantly depressed 203, elevated liver function test with an AST of 51 and an ALT of 115, triglycerides of 572, and HDL of 20. He was told that his uric acid level was 3 times the upper limits of normal. He was started on 600 mg a day of allopurinol. He has not had a uric acid level checked since that time. He also had an ultrasound of his liver obtained given the elevated liver function test which showed an enlarged fatty liver. He is here today to establish care.  At that time, my plan was: I would like the patient to return fasting for a CBC, CMP, uric acid level, fasting lipid panel , and a PSA. His testosterone is being treated by his pain management doctor. He will be due for repeat digital rectal exam and PSA in December. If his uric acid level is not less than 6, I would recommend switching him off the allopurinol due to the risk of liver toxicity and switching him to uloric.   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 Also believe he has fatty liver disease secondary to his dyslipidemia. If this is confirmed on a fasting lipid panel as his other lipid panel was not fasting, I will start the patient on fenofibrate 160 mg by mouth daily. He is not due for colonoscopy until age 9  09/18/15 Patient is here today for follow-up of his dyslipidemia. He is currently taking fenofibrate 160 mg by mouth daily. He  denies any right upper quadrant pain. He denies any new myalgias although he does have significant body pain secondary to his service related injuries. He also has an underlying history of fatty liver disease. He is here today to monitor a CMP to watch for worsening of his fatty liver disease, to evaluate for any signs of liver irritation secondary to the fenofibrate, and also monitor his dyslipidemia. He does report pain in his feet. It is worse in the right foot although it is also present in the left foot. He describes it as a generalized ache. The pain seems to be located at approximately the fourth MTP joint primarily on the plantar aspect of both feet. There are no palpable deformities in this area. There is no swelling although he is tender to palpation in that area. His uric acid level was excellent at his last blood work at 4.1 and he is in steady state and therefore I do not believe this is likely gout. I would be concerned about possible arthritis as a potential cause. He is already taking diclofenac 35 mg by mouth 3 times a day.  At that time, my plan was: check a fasting lipid panel to monitor his triglycerides as well as his HDL cholesterol. I will also check a CMP to monitor his liver function test.  Trigs were previously >  200 and HDl was <30.  Hopefully we will see some improvement since starting fenofibrate. If his liver function tests can tolerate, I will suggest increasing diclofenac to 75 mg by mouth twice a day to treat the pain in his feet which I assume is osteoarthritis. Patient states that he has had x-rays taken at his pain management specialist.  02/02/16 He continues to take allopurinol 600 mg every day. He has not had a gout flare in more than a year. 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 Since Christmas, he has been eating 2 eggs every morning for breakfast. He is also been eating more meat and not exercising regularly. As  result is triglycerides have doubled more than 360, his liver function tests have become slightly elevated. His HDL cholesterol has dropped from 26-22 Past Medical History  Diagnosis Date  . Hypertriglyceridemia   . Hypogonadism in male   . Fatty liver disease, nonalcoholic   . Obesity   . Gout   . Neuromuscular disorder [facility])    Past Surgical History  Procedure Laterality Date  . Cervical fusion      Dr. Jeral Fruit  . Spine surgery      Current outpatient prescriptions:  .  allopurinol (ZYLOPRIM) 300 MG tablet, TK 1 T PO BID, Disp: , Rfl: 11 .  diazepam (VALIUM) 10 MG tablet, TK 1 T PO TID PRN, Disp: , Rfl: 4 .  fenofibrate 160 MG tablet, Take 1 tablet (160 mg total) by mouth daily., Disp: 90 tablet, Rfl: 3 .  GRALISE 300 MG TABS, TK 1 T PO QD - Along with 600 mg qhs = 1500 mg qhs, Disp: , Rfl: 4 .  GRALISE 600 MG TABS, TK 2 TS PO QHS - Along with 1 300 mg tanblet =1500mg , Disp: , Rfl: 4 .  magnesium 30 MG tablet, Take 1,200 mg by mouth at bedtime. , Disp: , Rfl:  .  Potassium Gluconate 550 MG TABS, Take by mouth., Disp: , Rfl:  .  Vitamin D, Ergocalciferol, (DRISDOL) 50000 UNITS CAPS capsule, TK 1 C PO 1 TIMES A WEEK, Disp: , Rfl: 3 .  ZORVOLEX 35 MG CAPS, TK 1 C PO TID  (Diclofenac), Disp: , Rfl: 4 .  colchicine 0.6 MG tablet, Reported on 02/01/2016, Disp: , Rfl: 0 No Known Allergies Social History   Social History  . Marital Status: Legally Separated    Spouse Name: N/A  . Number of Children: N/A  . Years of Education: N/A   Occupational History  . Not on file.   Social History Main Topics  . Smoking status: Former Smoker    Quit date: 10/10/2010  . Smokeless tobacco: Never Used  . Alcohol Use: No  . Drug Use: No  . Sexual Activity: Not Currently   Other Topics Concern  . Not on file   Social History Narrative   Family History  Problem Relation Age of Onset  . Cancer Mother 70    unknown primary  . Early death Mother   . Diabetes Father   . Hearing loss  Father   . Diabetes Brother   . Alcohol abuse Paternal Grandfather       Review of Systems  All other systems reviewed and are negative.      Objective:   Physical Exam  Constitutional: He appears well-developed and well-nourished. No distress.  HENT:  Head: Normocephalic and atraumatic.  Eyes: Conjunctivae and EOM are normal. Pupils  are equal, round, and reactive to light. Right eye exhibits no discharge. Left eye exhibits no discharge. No scleral icterus.  Cardiovascular: Normal rate, regular rhythm and normal heart sounds.   No murmur heard. Pulmonary/Chest: Effort normal and breath sounds normal. No respiratory distress. He has no wheezes. He has no rales. He exhibits no tenderness.  Abdominal: Soft. Bowel sounds are normal. He exhibits no distension. There is no tenderness.  Skin: He is not diaphoretic.  Psychiatric: Thought content normal.  Vitals reviewed.         Assessment & Plan:    Dyslipidemia  Fatty liver disease, nonalcoholic  We had a long discussion regarding starting Niaspan to raise his HDL cholesterol. After a long discussion, the patient would like to try aggressive lifestyle changes including decreasing consumption of animal meat, discontinuation of eggs for breakfast, and regular aerobic exercise. He would like to recheck LFTs and a fasting lipid panel in 6 months. Decrease allopurinol to 300 mg a day. Recheck uric acid in 6 months.

## 2016-02-02 ENCOUNTER — Encounter: Payer: Self-pay | Admitting: Family Medicine

## 2016-06-11 ENCOUNTER — Other Ambulatory Visit: Payer: Self-pay | Admitting: Family Medicine

## 2016-06-26 ENCOUNTER — Other Ambulatory Visit: Payer: Self-pay | Admitting: Family Medicine

## 2016-08-04 ENCOUNTER — Ambulatory Visit: Payer: No Typology Code available for payment source | Admitting: Family Medicine

## 2016-08-05 ENCOUNTER — Encounter: Payer: Self-pay | Admitting: Family Medicine

## 2016-08-05 ENCOUNTER — Ambulatory Visit (INDEPENDENT_AMBULATORY_CARE_PROVIDER_SITE_OTHER): Payer: No Typology Code available for payment source | Admitting: Family Medicine

## 2016-08-05 VITALS — BP 124/78 | HR 81 | Temp 98.0°F | Resp 16 | Ht 71.0 in | Wt 245.0 lb

## 2016-08-05 DIAGNOSIS — E785 Hyperlipidemia, unspecified: Secondary | ICD-10-CM

## 2016-08-05 DIAGNOSIS — Z23 Encounter for immunization: Secondary | ICD-10-CM

## 2016-08-05 DIAGNOSIS — Z125 Encounter for screening for malignant neoplasm of prostate: Secondary | ICD-10-CM | POA: Diagnosis not present

## 2016-08-05 DIAGNOSIS — Z1211 Encounter for screening for malignant neoplasm of colon: Secondary | ICD-10-CM | POA: Diagnosis not present

## 2016-08-05 DIAGNOSIS — K760 Fatty (change of) liver, not elsewhere classified: Secondary | ICD-10-CM

## 2016-08-08 ENCOUNTER — Encounter (INDEPENDENT_AMBULATORY_CARE_PROVIDER_SITE_OTHER): Payer: Self-pay | Admitting: *Deleted

## 2016-08-08 NOTE — Progress Notes (Signed)
Subjective:    Patient ID: Victor Murray, male    DOB: [birth year], 50 y.o.   MRN: [id number]  HPI 06/08/15  patient is here today to establish care. He was injured in 2009 while serving in [place]. He injured his neck. He underwent ACDF of 4 cervical vertebrae by Dr. Jeral Fruit.   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 They have been managed with Gralise and diclofenac.   Patient is still trying to work. He wants to become more active. Recently and lab work he was found to have a testosterone level significantly depressed 203, elevated liver function test with an AST of 51 and an ALT of 115, triglycerides of 572, and HDL of 20. He was told that his uric acid level was 3 times the upper limits of normal. He was started on 600 mg a day of allopurinol. He has not had a uric acid level checked since that time. He also had an ultrasound of his liver obtained given the elevated liver function test which showed an enlarged fatty liver. He is here today to establish care.  At that time, my plan was: I would like the patient to return fasting for a CBC, CMP, uric acid level, fasting lipid panel , and a PSA. His testosterone is being treated by his pain management doctor. He will be due for repeat digital rectal exam and PSA in December. If his uric acid level is not less than 6, I would recommend switching him off the allopurinol due to the risk of liver toxicity and switching him to uloric.   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 Also believe he has fatty liver disease secondary to his dyslipidemia. If this is confirmed on a fasting lipid panel as his other lipid panel was not fasting, I will start the patient on fenofibrate 160 mg by mouth daily. He is not due for colonoscopy until age 2  09/18/15 Patient is here today for follow-up of his dyslipidemia. He is currently taking fenofibrate 160 mg by mouth daily. He denies  any right upper quadrant pain. He denies any new myalgias although he does have significant body pain secondary to his service related injuries. He also has an underlying history of fatty liver disease. He is here today to monitor a CMP to watch for worsening of his fatty liver disease, to evaluate for any signs of liver irritation secondary to the fenofibrate, and also monitor his dyslipidemia. He does report pain in his feet. It is worse in the right foot although it is also present in the left foot. He describes it as a generalized ache. The pain seems to be located at approximately the fourth MTP joint primarily on the plantar aspect of both feet. There are no palpable deformities in this area. There is no swelling although he is tender to palpation in that area. His uric acid level was excellent at his last blood work at 4.1 and he is in steady state and therefore I do not believe this is likely gout. I would be concerned about possible arthritis as a potential cause. He is already taking diclofenac 35 mg by mouth 3 times a day.  At that time, my plan was: check a fasting lipid panel to monitor his triglycerides as well as his HDL cholesterol. I will also check a CMP to monitor his liver function test.  Trigs were previously > 200  and HDl was <30.  Hopefully we will see some improvement since starting fenofibrate. If his liver function tests can tolerate, I will suggest increasing diclofenac to 75 mg by mouth twice a day to treat the pain in his feet which I assume is osteoarthritis. Patient states that he has had x-rays taken at his pain management specialist.  02/02/16 He continues to take allopurinol 600 mg every day. He has not had a gout flare in more than a year. 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 Since Christmas, he has been eating 2 eggs every morning for breakfast. He is also been eating more meat and not exercising regularly. As result is  triglycerides have doubled more than 360, his liver function tests have become slightly elevated. His HDL cholesterol has dropped from 26-22.  At that time, my plan was: We had a long discussion regarding starting Niaspan to raise his HDL cholesterol. After a long discussion, the patient would like to try aggressive lifestyle changes including decreasing consumption of animal meat, discontinuation of eggs for breakfast, and regular aerobic exercise. He would like to recheck LFTs and a fasting lipid panel in 6 months. Decrease allopurinol to 300 mg a day. Recheck uric acid in 6 months.  08/08/16 Here today for follow-up. He has not significantly changed his diet. He is not exercising regularly. He is due for colonoscopy. He is due for a flu shot. He is due for prostate cancer screening. He denies any chest pain shortness of breath or dyspnea on exertion. He denies any myalgias or right upper quadrant pain. He is due to recheck a fasting lipid panel as well as liver function test. Past Medical History:  Diagnosis Date  . Fatty liver disease, nonalcoholic   . Gout   . Hypertriglyceridemia   . Hypogonadism in male   . Neuromuscular disorder (HCC)   . Obesity    Past Surgical History:  Procedure Laterality Date  . cervical fusion     Dr. Jeral FruitBotero  . SPINE SURGERY      Current Outpatient Prescriptions:  .  allopurinol (ZYLOPRIM) 300 MG tablet, 1 PO BID, Disp: 60 tablet, Rfl: 11 .  colchicine 0.6 MG tablet, Reported on 02/01/2016, Disp: , Rfl: 0 .  diazepam (VALIUM) 10 MG tablet, TK 1 T PO TID PRN, Disp: , Rfl: 4 .  fenofibrate 160 MG tablet, TAKE ONE TABLET BY MOUTH ONCE DAILY, Disp: 30 tablet, Rfl: 10 .  GRALISE 300 MG TABS, TK 1 T PO QD - Along with 600 mg qhs = 1500 mg qhs, Disp: , Rfl: 4 .  GRALISE 600 MG TABS, TK 2 TS PO QHS - Along with 1 300 mg tanblet =1500mg , Disp: , Rfl: 4 .  magnesium 30 MG tablet, Take 1,200 mg by mouth at bedtime. , Disp: , Rfl:  .  Potassium Gluconate 550 MG TABS,  Take by mouth., Disp: , Rfl:  .  Vitamin D, Ergocalciferol, (DRISDOL) 50000 UNITS CAPS capsule, TK 1 C PO 1 TIMES A WEEK, Disp: , Rfl: 3 .  ZORVOLEX 35 MG CAPS, TK 1 C PO TID  (Diclofenac), Disp: , Rfl: 4 .  NP THYROID 60 MG tablet, Take 60 mg by mouth daily., Disp: , Rfl: 5 No Known Allergies Social History   Social History  . Marital status: Legally Separated    Spouse name: N/A  . Number of children: N/A  . Years of education: N/A   Occupational  History  . Not on file.   Social History Main Topics  . Smoking status: Former Smoker    Quit date: 10/10/2010  . Smokeless tobacco: Never Used  . Alcohol use No  . Drug use: No  . Sexual activity: Not Currently   Other Topics Concern  . Not on file   Social History Narrative  . No narrative on file   Family History  Problem Relation Age of Onset  . Cancer Mother 5148    unknown primary  . Early death Mother   . Diabetes Father   . Hearing loss Father   . Diabetes Brother   . Alcohol abuse Paternal Grandfather       Review of Systems  All other systems reviewed and are negative.      Objective:   Physical Exam  Constitutional: He appears well-developed and well-nourished. No distress.  HENT:  Head: Normocephalic and atraumatic.  Eyes: Conjunctivae and EOM are normal. Pupils are equal, round, and reactive to light. Right eye exhibits no discharge. Left eye exhibits no discharge. No scleral icterus.  Cardiovascular: Normal rate, regular rhythm and normal heart sounds.   No murmur heard. Pulmonary/Chest: Effort normal and breath sounds normal. No respiratory distress. He has no wheezes. He has no rales. He exhibits no tenderness.  Abdominal: Soft. Bowel sounds are normal. He exhibits no distension. There is no tenderness.  Skin: He is not diaphoretic.  Psychiatric: Thought content normal.  Vitals reviewed.         Assessment & Plan:    Flu vaccine need - Plan: Flu Vaccine QUAD 36+ mos IM  Dyslipidemia - Plan:  CBC with Differential/Platelet, COMPLETE METABOLIC PANEL WITH GFR, Lipid panel  Fatty liver disease, nonalcoholic  Colon cancer screening - Plan: Ambulatory referral to Gastroenterology  Prostate cancer screening - Plan: PSA Patient received his flu shot. We'll schedule the patient for colonoscopy. We'll check a PSA. Check CBC, CMP, fasting lipid panel. Goal HDL cholesterol is greater than 40. Goal triglycerides are less than 150. Discussed therapeutic lifestyle changes including 30 minutes a day 5 days a week of aerobic exercise and 15-20 pounds of weight loss including a low saturated fat diet

## 2016-08-12 ENCOUNTER — Other Ambulatory Visit: Payer: No Typology Code available for payment source

## 2016-08-19 ENCOUNTER — Other Ambulatory Visit: Payer: No Typology Code available for payment source

## 2016-08-19 DIAGNOSIS — Z125 Encounter for screening for malignant neoplasm of prostate: Secondary | ICD-10-CM

## 2016-08-19 DIAGNOSIS — E785 Hyperlipidemia, unspecified: Secondary | ICD-10-CM

## 2016-08-19 LAB — LIPID PANEL
CHOL/HDL RATIO: 6.9 ratio — AB (ref <5.0)
CHOLESTEROL: 180 mg/dL (ref <200)
HDL: 26 mg/dL — ABNORMAL LOW (ref >40)
LDL Cholesterol: 102 mg/dL — ABNORMAL HIGH (ref <100)
TRIGLYCERIDES: 259 mg/dL — AB (ref <150)
VLDL: 52 mg/dL — AB (ref <30)

## 2016-08-19 LAB — COMPLETE METABOLIC PANEL WITH GFR
ALBUMIN: 4.5 g/dL (ref 3.6–5.1)
ALK PHOS: 70 U/L (ref 40–115)
ALT: 76 U/L — ABNORMAL HIGH (ref 9–46)
AST: 49 U/L — AB (ref 10–35)
BUN: 20 mg/dL (ref 7–25)
CALCIUM: 9.4 mg/dL (ref 8.6–10.3)
CHLORIDE: 106 mmol/L (ref 98–110)
CO2: 21 mmol/L (ref 20–31)
Creat: 1 mg/dL (ref 0.70–1.33)
GFR, EST NON AFRICAN AMERICAN: 87 mL/min (ref >=60)
Glucose, Bld: 106 mg/dL — ABNORMAL HIGH (ref 70–99)
POTASSIUM: 4.8 mmol/L (ref 3.5–5.3)
Sodium: 138 mmol/L (ref 135–146)
Total Bilirubin: 0.4 mg/dL (ref 0.2–1.2)
Total Protein: 7.4 g/dL (ref 6.1–8.1)

## 2016-08-20 LAB — CBC WITH DIFFERENTIAL/PLATELET
BASOS ABS: 0 {cells}/uL (ref 0–200)
Basophils Relative: 0 %
EOS ABS: 288 {cells}/uL (ref 15–500)
EOS PCT: 4 %
HEMATOCRIT: 41.7 % (ref 38.5–50.0)
HEMOGLOBIN: 13.8 g/dL (ref 13.0–17.0)
LYMPHS ABS: 4104 {cells}/uL — AB (ref 850–3900)
Lymphocytes Relative: 57 %
MCH: 20.2 pg — ABNORMAL LOW (ref 27.0–33.0)
MCHC: 33.3 g/dL (ref 32.0–36.0)
MCV: 95.5 fL (ref 80.0–100.0)
MONO ABS: 360 {cells}/uL (ref 200–950)
MPV: 10.4 fL (ref 7.5–12.5)
Monocytes Relative: 5 %
NEUTROS ABS: 2448 {cells}/uL (ref 1500–7800)
NEUTROS PCT: 34 %
Platelets: 133 10*3/uL — ABNORMAL LOW (ref 140–400)
RBC: 6.82 MIL/uL — ABNORMAL HIGH (ref 4.20–5.80)
RDW: 13.9 % (ref 11.0–15.0)
WBC: 7.2 10*3/uL (ref 3.8–10.8)

## 2016-08-20 LAB — PSA: PSA: 0.7 ng/mL (ref <=4.0)

## 2017-03-07 ENCOUNTER — Other Ambulatory Visit: Payer: Self-pay | Admitting: Family Medicine

## 2018-02-12 ENCOUNTER — Other Ambulatory Visit: Payer: Self-pay | Admitting: Family Medicine

## 2019-02-12 ENCOUNTER — Ambulatory Visit (INDEPENDENT_AMBULATORY_CARE_PROVIDER_SITE_OTHER): Payer: Self-pay | Admitting: Internal Medicine

## 2019-03-18 ENCOUNTER — Ambulatory Visit: Payer: No Typology Code available for payment source | Admitting: Podiatry

## 2019-03-19 ENCOUNTER — Other Ambulatory Visit: Payer: Self-pay

## 2019-03-19 ENCOUNTER — Ambulatory Visit (INDEPENDENT_AMBULATORY_CARE_PROVIDER_SITE_OTHER): Payer: No Typology Code available for payment source

## 2019-03-19 ENCOUNTER — Encounter: Payer: Self-pay | Admitting: Podiatry

## 2019-03-19 ENCOUNTER — Ambulatory Visit (INDEPENDENT_AMBULATORY_CARE_PROVIDER_SITE_OTHER): Payer: No Typology Code available for payment source | Admitting: Podiatry

## 2019-03-19 VITALS — BP 115/76 | HR 82 | Temp 97.9°F | Resp 16

## 2019-03-19 DIAGNOSIS — M205X1 Other deformities of toe(s) (acquired), right foot: Secondary | ICD-10-CM

## 2019-03-19 DIAGNOSIS — M778 Other enthesopathies, not elsewhere classified: Secondary | ICD-10-CM

## 2019-03-19 DIAGNOSIS — M779 Enthesopathy, unspecified: Secondary | ICD-10-CM

## 2019-03-19 NOTE — Patient Instructions (Signed)
Pre-Operative Instructions  Congratulations, you have decided to take an important step towards improving your quality of life.  You can be assured that the doctors and staff at [facility] will be with you every step of the way.  Here are some important things you should know:  1. Plan to be at the surgery center/hospital at least 1 (one) hour prior to your scheduled time, unless otherwise directed by the surgical center/hospital staff.  You must have a responsible adult accompany you, remain during the surgery and drive you home.  Make sure you have directions to the surgical center/hospital to ensure you arrive on time. 2. If you are having surgery at [facility] or [facility], you will need a copy of your medical history and physical form from your family physician within one month prior to the date of surgery. We will give you a form for your primary physician to complete.  3. We make every effort to accommodate the date you request for surgery.  However, there are times where surgery dates or times have to be moved.  We will contact you as soon as possible if a change in schedule is required.   4. No aspirin/ibuprofen for one week before surgery.  If you are on aspirin, any non-steroidal anti-inflammatory medications (Mobic, Aleve, Ibuprofen) should not be taken seven (7) days prior to your surgery.  You make take Tylenol for pain prior to surgery.  5. Medications - If you are taking daily heart and blood pressure medications, seizure, reflux, allergy, asthma, anxiety, pain or diabetes medications, make sure you notify the surgery center/hospital before the day of surgery so they can tell you which medications you should take or avoid the day of surgery. 6. No food or drink after midnight the night before surgery unless directed otherwise by surgical center/hospital staff. 7. No alcoholic beverages 24-hours prior to surgery.  No smoking 24-hours prior or 24-hours after  surgery. 8. Wear loose pants or shorts. They should be loose enough to fit over bandages, boots, and casts. 9. Don't wear slip-on shoes. Sneakers are preferred. 10. Bring your boot with you to the surgery center/hospital.  Also bring crutches or a walker if your physician has prescribed it for you.  If you do not have this equipment, it will be provided for you after surgery. 11. If you have not been contacted by the surgery center/hospital by the day before your surgery, call to confirm the date and time of your surgery. 12. Leave-time from work may vary depending on the type of surgery you have.  Appropriate arrangements should be made prior to surgery with your employer. 13. Prescriptions will be provided immediately following surgery by your doctor.  Fill these as soon as possible after surgery and take the medication as directed. Pain medications will not be refilled on weekends and must be approved by the doctor. 14. Remove nail polish on the operative foot and avoid getting pedicures prior to surgery. 15. Wash the night before surgery.  The night before surgery wash the foot and leg well with water and the antibacterial soap provided. Be sure to pay special attention to beneath the toenails and in between the toes.  Wash for at least three (3) minutes. Rinse thoroughly with water and dry well with a towel.  Perform this wash unless told not to do so by your physician.  Enclosed: 1 Ice pack (please put in freezer the night before surgery)   1 Hibiclens skin cleaner     Pre-op instructions  If you have any questions regarding the instructions, please do not hesitate to call our office.  [place]: [address], [place], [place] [address] -- [phone]  [place]: [address] [place], [place] [address] -- [phone]  [place]: [address]  [place], [place] [address] -- [phone]  [place]: [address], [address], [place], [place] [address] -- [phone]  Website:  [email or web address] 

## 2019-03-19 NOTE — Progress Notes (Signed)
Subjective:  Patient ID: KYREE ADRIANO, male    DOB: [birth year],  MRN: [id number] HPI Chief Complaint  Patient presents with  . Foot Pain    1st MPJ right - knot at joint x years, history of gout, limited ROM, gets callused area medial hallux that splits  . New Patient (Initial Visit)    53 y.o. male presents with the above complaint.   ROS: He denies fever chills nausea vomiting muscle aches pains calf pain back pain chest pain shortness of breath.  Past Medical History:  Diagnosis Date  . Fatty liver disease, nonalcoholic   . Gout   . Hypertriglyceridemia   . Hypogonadism in male   . Neuromuscular disorder ([place])   . Obesity    Past Surgical History:  Procedure Laterality Date  . cervical fusion     Dr. Joya Salm  . SPINE SURGERY      Current Outpatient Medications:  .  Ascorbic Acid (VITAMIN C PO), Take by mouth., Disp: , Rfl:  .  gabapentin (NEURONTIN) 300 MG capsule, Take 300 mg by mouth 3 (three) times daily., Disp: , Rfl:  .  Multiple Vitamin (MULTIVITAMIN) capsule, Take 1 capsule by mouth daily., Disp: , Rfl:  .  Multiple Vitamins-Minerals (ZINC PO), Take by mouth., Disp: , Rfl:  .  testosterone cypionate (DEPOTESTOTERONE CYPIONATE) 100 MG/ML injection, Inject into the muscle every 14 (fourteen) days. For IM use only, Disp: , Rfl:  .  allopurinol (ZYLOPRIM) 300 MG tablet, TAKE 1 TABLET BY MOUTH TWICE DAILY, Disp: 60 tablet, Rfl: 0 .  magnesium 30 MG tablet, Take 1,200 mg by mouth at bedtime. , Disp: , Rfl:  .  NP THYROID 60 MG tablet, Take 60 mg by mouth daily., Disp: , Rfl: 5 .  Potassium Gluconate 550 MG TABS, Take by mouth., Disp: , Rfl:  .  Vitamin D, Ergocalciferol, (DRISDOL) 50000 UNITS CAPS capsule, TK 1 C PO 1 TIMES A WEEK, Disp: , Rfl: 3 .  ZORVOLEX 35 MG CAPS, TK 1 C PO TID  (Diclofenac), Disp: , Rfl: 4  No Known Allergies Review of Systems Objective:   Vitals:   03/19/19 1055  BP: 115/76  Pulse: 82  Resp: 16  Temp: 97.9 F (36.6 C)    General:  Well developed, nourished, in no acute distress, alert and oriented x3   Dermatological: Skin is warm, dry and supple bilateral. Nails x 10 are well maintained; remaining integument appears unremarkable at this time. There are no open sores, no preulcerative lesions, no rash or signs of infection present.  Vascular: Dorsalis Pedis artery and Posterior Tibial artery pedal pulses are 2/4 bilateral with immedate capillary fill time. Pedal hair growth present. No varicosities and no lower extremity edema present bilateral.   Neruologic: Grossly intact via light touch bilateral. Vibratory intact via tuning fork bilateral. Protective threshold with Semmes Wienstein monofilament intact to all pedal sites bilateral. Patellar and Achilles deep tendon reflexes 2+ bilateral. No Babinski or clonus noted bilateral.   Musculoskeletal: No gross boney pedal deformities bilateral. No pain, crepitus, or limitation noted with foot and ankle range of motion bilateral. Muscular strength 5/5 in all groups tested bilateral.  Limited range of motion and pain on palpation of the first metatarsophalangeal joint with hallux interphalangeal.  Is pain on palpation to the first metatarsal phalangeal joint limited dorsiflexion and plantar flexion.  Gait: Unassisted, Nonantalgic.    Radiographs:  Radiographs taken today demonstrate increase in the first met metatarsal angle greater than normal value consistent  with osteoarthritis possible gouty arthritis and hallux abductovalgus deformity there is also a hallux interphalangeal component to it as well.  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  At this time while he is out of work he would like to consider  having this surgically corrected.  We did go ahead and discuss surgery today consisting of an Henry Russel osteotomy with double screw fixation and a Akin osteotomy with screw fixation.  He understands this is amenable to a we did discuss the possible need for a Keller arthroplasty with single silicone implant.  I expressed to him that that would be an on-call item and that we will have to visually lay eyes on the joint first.  We also discussed the possible postop complications which may include but not limited to postop pain bleeding swelling infection recurrence need further surgery overcorrection under correction.  We dispensed a Cam walker today information and literature regarding the surgery center as well as the anesthesia group.  I will follow-up with him in the near future for surgical evaluation after medical clearance is made.     Leina Babe T. [place], [place]

## 2019-03-22 ENCOUNTER — Encounter: Payer: Self-pay | Admitting: *Deleted

## 2019-03-22 ENCOUNTER — Telehealth: Payer: Self-pay | Admitting: *Deleted

## 2019-03-22 NOTE — Telephone Encounter (Signed)
"  I haven't heard from anyone about scheduling my surgery."  I'll get you scheduled.  Do you have a date that you would like?  "I'd like to do it soon.  I'm on furlough for another month, so I'd like to have it done as soon as possible."  Dr. Milinda Pointer can do it on 04/05/2019.  "That date will be fine."  Dr. Milinda Pointer wants medical clearance.  Who is your primary care physician?  "Dr. Anastasio Champion is my physician.  His office is closed today but I'll call him on Monday.  Whose attention should I tell him to send it to?"  You can send it to me, [facility].  "What's your direct phone number?"  My direct number is [phone].  I'll get your surgery scheduled for 04/05/2019.  I sent a medical clearance request letter to Dr. Anastasio Champion per Dr. Milinda Pointer.

## 2019-03-22 NOTE — Progress Notes (Signed)
Per Dr. Milinda Pointer, I sent a medical clearance request letter to Dr. Anastasio Champion.

## 2019-03-25 ENCOUNTER — Telehealth: Payer: Self-pay | Admitting: *Deleted

## 2019-03-25 NOTE — Telephone Encounter (Signed)
"  Y'all got me scheduled for surgery on the 26 June.  I need to see if I can change the date on that.  If you could, give me a call back."

## 2019-03-26 DIAGNOSIS — M779 Enthesopathy, unspecified: Secondary | ICD-10-CM

## 2019-03-26 NOTE — Telephone Encounter (Signed)
"  I have a few things I want to ask you.  One is, does he have anything available about two weeks later after my surgery date of June 26?"  Dr. Milinda Pointer has something available on April 26, 2019.  "Alright, I'll check with my wife and see if that date is okay.  She had something planned already when I scheduled my surgery and she was upset.  So I want to make sure I'm not in the doghouse.  I'll call you back about that.  The second thing, I'm trying to register online with the surgery center.  How do I go about doing that?  I been trying to follow these MyChart instructions."  MyChart is not the link you use to register with the surgical center.  It's [facility] [employer] [facility].  The instructions are in the brochure that we gave you.  "I don't have a brochure."  Did you receive a little grey bag that has a gel pack and a scrub brush in it?  "I was not give a bag.  I guess I need to swing by there and pick one up huh?"  Yes, come by the office to retrieve one.  "I'll stop by, that will make things a whole lot simpler.  I'll call you back in a few minutes about a date, once I speak to my wife."

## 2019-03-28 NOTE — Telephone Encounter (Signed)
"  I'm sorry Unknown Flannigan, I didn't realize that I had left you hanging.  Let's just keep it on for June 26.  My wife was able to take the time off from work.  You know, sometimes things in life happen and you just have to deal with them."  You're right.  I'll leave it scheduled for 04/05/2019.

## 2019-03-28 NOTE — Telephone Encounter (Signed)
I left Mr. Victor Murray messages to call me back.  I was following up to see if he decided on a date for his surgery.

## 2019-03-29 ENCOUNTER — Telehealth: Payer: Self-pay | Admitting: *Deleted

## 2019-03-29 NOTE — Telephone Encounter (Signed)
DOS 04/05/2019; 68088 - [facility] [place]; 11031 - [facility] OSTEOTOMY RIGHT FOOT  MULTIPLAN PHCS: Effective Date - 10/10/2014  Deductible - $1400 Co-insurance - 80% Out of Pocket - $5945  PRE-CERTIFICATION IS REQUIRED  Spoke to [place].  Call ref. # - [id number] She transferred me to Mickel Baas - ph. # - [phone] option 3, level 2 - surgeon  Mickel Baas informed me that clinicals needed to be faxed to [phone].  The pending case number is [id number].  CLINICALS MUST BE RECEIVED WITHIN 24 HOURS.  Clinicals were faxed 03/29/2019.

## 2019-04-01 ENCOUNTER — Telehealth: Payer: Self-pay | Admitting: *Deleted

## 2019-04-01 NOTE — Telephone Encounter (Signed)
I attempted to call the patient.  I left him a message that we received the medical clearance response from Dr. Anastasio Champion.  I informed him that Dr. Anastasio Champion said to stop taking the baby aspirin three to five days prior to the surgery date.  I asked him to call if he has any questions.

## 2019-04-01 NOTE — Telephone Encounter (Signed)
"  I'm scheduled to have surgery on Friday, June 26.  I still haven't heard anything about the time."  Someone from the surgical center will give you a call a day or two prior to your surgery date and will give you your arrival time.  "Okay, that solves that problem."  I am glad you called.  We still have not received medical clearance from Dr. Anastasio Champion.  I have sent him a medical clearance letter twice and we still haven't heard anything from him.  "I'll call him now and see what I can find out.  Do I need to call you back?"  No, just have him respond to the letter and he can fax it back to [place]."

## 2019-04-02 NOTE — Telephone Encounter (Signed)
Surgery scheduled for April 05, 2019 has been approved.  The notification number is [id number].  I called and informed Caren Griffins at [facility].

## 2019-04-04 ENCOUNTER — Other Ambulatory Visit: Payer: Self-pay | Admitting: Podiatry

## 2019-04-04 MED ORDER — ONDANSETRON HCL 4 MG PO TABS: 4.0000 mg | ORAL_TABLET | Freq: Three times a day (TID) | ORAL | 0 refills | Status: DC | PRN

## 2019-04-04 MED ORDER — OXYCODONE-ACETAMINOPHEN 10-325 MG PO TABS: 1.0000 | ORAL_TABLET | Freq: Four times a day (QID) | ORAL | 0 refills | Status: AC | PRN

## 2019-04-04 MED ORDER — CEPHALEXIN 500 MG PO CAPS: 500.0000 mg | ORAL_CAPSULE | Freq: Three times a day (TID) | ORAL | 0 refills | Status: DC

## 2019-04-05 DIAGNOSIS — M2011 Hallux valgus (acquired), right foot: Secondary | ICD-10-CM

## 2019-04-11 ENCOUNTER — Ambulatory Visit (INDEPENDENT_AMBULATORY_CARE_PROVIDER_SITE_OTHER): Payer: No Typology Code available for payment source

## 2019-04-11 ENCOUNTER — Ambulatory Visit (INDEPENDENT_AMBULATORY_CARE_PROVIDER_SITE_OTHER): Payer: No Typology Code available for payment source | Admitting: Podiatry

## 2019-04-11 ENCOUNTER — Encounter: Payer: Self-pay | Admitting: Podiatry

## 2019-04-11 ENCOUNTER — Other Ambulatory Visit: Payer: Self-pay

## 2019-04-11 VITALS — Temp 97.8°F

## 2019-04-11 DIAGNOSIS — M205X1 Other deformities of toe(s) (acquired), right foot: Secondary | ICD-10-CM

## 2019-04-11 DIAGNOSIS — Z9889 Other specified postprocedural states: Secondary | ICD-10-CM

## 2019-04-11 NOTE — Progress Notes (Signed)
He presents today for his first postop visit date of surgery is 04/05/2019 status post Victor Murray arthroplasty with a single silicone implant.  States that is doing pretty good he states that he only takes his pain medicine at nighttime.  Denies chest pain back pain shortness of breath and calf pain.  He states that he is been trying to wiggle her toes but he has not been walking on it as much he is using crutches as a partial weightbearing stance.  Objective: Presents today with a cam walker and crutches partial weightbearing stance was removed demonstrates a dry sterile dressing intact once removed demonstrates moderate edema to the right foot no erythema cellulitis drainage or odor toe is rectus good has great range of motion of the first metatarsal phalangeal joint patient is very happy with the outcome thus far.  Radiographs taken today demonstrates a size #4 right medical implant with grommets in good position toe is rectus no gas.  Soft tissue swelling dorsally and in the first intermetatarsal space.  Assessment: Well-healing surgical toe.  Plan: Redressed today dressed a compressive dressing encourage range of motion exercises encouraged him not to use his crutches but continue to keep the foot elevated.  I will follow-up with him in 1 week for suture removal compression anklet and a Darco shoe

## 2019-04-18 ENCOUNTER — Other Ambulatory Visit: Payer: No Typology Code available for payment source

## 2019-04-19 ENCOUNTER — Ambulatory Visit (INDEPENDENT_AMBULATORY_CARE_PROVIDER_SITE_OTHER): Payer: No Typology Code available for payment source | Admitting: *Deleted

## 2019-04-19 ENCOUNTER — Other Ambulatory Visit: Payer: Self-pay

## 2019-04-19 VITALS — Temp 98.0°F

## 2019-04-19 DIAGNOSIS — M205X1 Other deformities of toe(s) (acquired), right foot: Secondary | ICD-10-CM

## 2019-04-19 DIAGNOSIS — Z9889 Other specified postprocedural states: Secondary | ICD-10-CM

## 2019-04-19 NOTE — Progress Notes (Signed)
Patient presents for his 2nd post op visit, DOS 04/05/2019 status post keller arthroplasty with single silicone implant left foot. He states he is doing pretty good. He says he's had trouble keeping his bandage on, "keeps slipping", however, bandages were still on and intact. He states he has been doing his toe exercises as instructed.  Incision has a very superficial opening measuring about 1/4 ",no redness, still moderate swelling in the foot. Suture ends were removed. Steri strips were placed over the area and covered with dry, non-stick pad and secured with tape.   Advised patient he could shower in 48 hours and let steri strips peel up on their own. Patient was dispensed a darco surgical shoe and compression anklet. Instructions for wear was given. Reviewed signs of infection with patient and he will call with concerns. Follow up in 2 weeks with Dr. Milinda Pointer and will xray at that time.

## 2019-05-02 ENCOUNTER — Other Ambulatory Visit: Payer: No Typology Code available for payment source

## 2019-05-03 ENCOUNTER — Other Ambulatory Visit: Payer: No Typology Code available for payment source

## 2019-05-07 ENCOUNTER — Encounter: Payer: Self-pay | Admitting: Podiatry

## 2019-05-07 ENCOUNTER — Other Ambulatory Visit: Payer: Self-pay

## 2019-05-07 ENCOUNTER — Ambulatory Visit (INDEPENDENT_AMBULATORY_CARE_PROVIDER_SITE_OTHER): Payer: Self-pay | Admitting: Podiatry

## 2019-05-07 ENCOUNTER — Ambulatory Visit (INDEPENDENT_AMBULATORY_CARE_PROVIDER_SITE_OTHER): Payer: No Typology Code available for payment source

## 2019-05-07 VITALS — Temp 97.2°F

## 2019-05-07 DIAGNOSIS — Z9889 Other specified postprocedural states: Secondary | ICD-10-CM

## 2019-05-07 DIAGNOSIS — M205X1 Other deformities of toe(s) (acquired), right foot: Secondary | ICD-10-CM

## 2019-05-08 NOTE — Progress Notes (Signed)
He presents today date of surgery 04/05/2019 status post Victor Murray.  States that he seems to be doing pretty good.  Assessment: Well-healing surgical foot  Plan: Great range of motion get back into regular shoe gear follow-up with me in 1 month

## 2019-05-09 ENCOUNTER — Encounter: Payer: Self-pay | Admitting: Podiatry

## 2019-05-16 ENCOUNTER — Other Ambulatory Visit: Payer: No Typology Code available for payment source

## 2019-06-04 ENCOUNTER — Ambulatory Visit (INDEPENDENT_AMBULATORY_CARE_PROVIDER_SITE_OTHER): Payer: No Typology Code available for payment source

## 2019-06-04 ENCOUNTER — Ambulatory Visit (INDEPENDENT_AMBULATORY_CARE_PROVIDER_SITE_OTHER): Payer: No Typology Code available for payment source | Admitting: Podiatry

## 2019-06-04 ENCOUNTER — Encounter: Payer: Self-pay | Admitting: Podiatry

## 2019-06-04 ENCOUNTER — Other Ambulatory Visit: Payer: Self-pay

## 2019-06-04 DIAGNOSIS — M205X1 Other deformities of toe(s) (acquired), right foot: Secondary | ICD-10-CM

## 2019-06-04 DIAGNOSIS — Z9889 Other specified postprocedural states: Secondary | ICD-10-CM

## 2019-06-04 MED ORDER — CELECOXIB 100 MG PO CAPS: 100.0000 mg | ORAL_CAPSULE | Freq: Two times a day (BID) | ORAL | 3 refills | Status: DC

## 2019-06-05 ENCOUNTER — Encounter: Payer: Self-pay | Admitting: Podiatry

## 2019-06-05 NOTE — Progress Notes (Signed)
He presents today date of surgery 04/05/2019 status post Victor Murray arthroplasty of the single silicone implant right he states were getting there but is still swollen I can get it into any shoes yet but it seems to be feeling a lot better.  Objective: Vital signs are stable he is alert and oriented x3.  Pulses are palpable.  Neurologic sensorium is intact degenerative flexors are intact muscle strength normal symmetrical.  Cutaneous evaluation and straits multiple scrapes and cuts that appear to be healing to the plantar aspect of the foot medial aspect of the foot and leg and lateral leg.  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  He still has swelling and limited range of motion of the first metatarsal phalangeal joint of the right foot.  Radiographs taken today demonstrate normal osseous architecture with a Keller arthroplasty single silicone implant in good position.  Assessment: Well-healing surgical foot is still stiff and swollen status post Keller arthroplasty single silicone implant date of surgery 04/05/2019.  Plan: Discussed etiology pathology and surgical therapies at this point time encourage range of motion exercises continue to keep it elevated massage therapy and for him to try to get it into his work shoes I will follow-up with him in 1 month.  X-rays be performed at that time.

## 2019-06-11 ENCOUNTER — Other Ambulatory Visit (INDEPENDENT_AMBULATORY_CARE_PROVIDER_SITE_OTHER): Payer: Self-pay | Admitting: Internal Medicine

## 2019-06-18 ENCOUNTER — Encounter: Payer: Self-pay | Admitting: Podiatry

## 2019-07-02 ENCOUNTER — Ambulatory Visit (INDEPENDENT_AMBULATORY_CARE_PROVIDER_SITE_OTHER): Payer: Self-pay | Admitting: Podiatry

## 2019-07-02 ENCOUNTER — Ambulatory Visit (INDEPENDENT_AMBULATORY_CARE_PROVIDER_SITE_OTHER): Payer: No Typology Code available for payment source

## 2019-07-02 ENCOUNTER — Other Ambulatory Visit: Payer: Self-pay

## 2019-07-02 ENCOUNTER — Encounter: Payer: Self-pay | Admitting: Podiatry

## 2019-07-02 DIAGNOSIS — Z9889 Other specified postprocedural states: Secondary | ICD-10-CM

## 2019-07-02 DIAGNOSIS — M205X1 Other deformities of toe(s) (acquired), right foot: Secondary | ICD-10-CM

## 2019-07-02 NOTE — Progress Notes (Signed)
He presents today date of surgery 04/05/2019 status post Victor Murray arthroplasty first metatarsal phalangeal joint of the right foot.  He states that other than the swelling and some areas of numbness he is doing quite well he is very happy with the outcome.  He denies chest pain back pain shortness of breath.  Objective: Vital signs are stable alert and oriented x3.  Pulses are palpable.  Neurologic sensorium is intact dT reflexes are intact some swelling to the dorsum of the foot appears to be pitting in nature.  Has great range of motion of the first metatarsophalangeal joint.  Radiographs demonstrate a well-healing Keller arthroplasty with single silicone implant and grommets in good position.  Assessment: Well-healing surgical foot.  Follow-up with him as needed.

## 2019-07-03 ENCOUNTER — Other Ambulatory Visit (INDEPENDENT_AMBULATORY_CARE_PROVIDER_SITE_OTHER): Payer: Self-pay | Admitting: Internal Medicine

## 2019-07-20 ENCOUNTER — Other Ambulatory Visit (INDEPENDENT_AMBULATORY_CARE_PROVIDER_SITE_OTHER): Payer: Self-pay | Admitting: Internal Medicine

## 2019-07-22 ENCOUNTER — Ambulatory Visit (INDEPENDENT_AMBULATORY_CARE_PROVIDER_SITE_OTHER): Payer: Self-pay | Admitting: Internal Medicine

## 2019-07-31 ENCOUNTER — Other Ambulatory Visit: Payer: Self-pay

## 2019-07-31 ENCOUNTER — Encounter (INDEPENDENT_AMBULATORY_CARE_PROVIDER_SITE_OTHER): Payer: Self-pay | Admitting: Internal Medicine

## 2019-07-31 ENCOUNTER — Ambulatory Visit (INDEPENDENT_AMBULATORY_CARE_PROVIDER_SITE_OTHER): Payer: No Typology Code available for payment source | Admitting: Internal Medicine

## 2019-07-31 VITALS — BP 120/82 | HR 72 | Ht 71.0 in | Wt 249.0 lb

## 2019-07-31 DIAGNOSIS — E291 Testicular hypofunction: Secondary | ICD-10-CM

## 2019-07-31 DIAGNOSIS — K760 Fatty (change of) liver, not elsewhere classified: Secondary | ICD-10-CM | POA: Diagnosis not present

## 2019-07-31 DIAGNOSIS — Z6835 Body mass index (BMI) 35.0-35.9, adult: Secondary | ICD-10-CM | POA: Diagnosis not present

## 2019-07-31 DIAGNOSIS — E6609 Other obesity due to excess calories: Secondary | ICD-10-CM | POA: Diagnosis not present

## 2019-07-31 NOTE — Patient Instructions (Signed)
[email or web address] 

## 2019-07-31 NOTE — Progress Notes (Signed)
Wellness Office Visit  Subjective:  Patient ID: Victor Murray, male    DOB: 1966-08-27  Age: 53 y.o. MRN: [id number]  CC: This man comes in for follow-up regarding obesity, fatty liver disease, hypogonadism and gout. HPI  He has been doing somewhat better in terms of nutrition but is frustrated that he has not lost further weight.  He has been doing some intermittent fasting but tends to eat unhealthy foods overall. His consistent with testosterone therapy and I increased the dose last time and he seems to have tolerated this without any problems. He is also taking desiccated thyroid, off label. He is also taking vitamin D3 for vitamin D deficiency. Past Medical History:  Diagnosis Date  . Fatty liver disease, nonalcoholic   . Gout   . Hypertriglyceridemia   . Hypogonadism in male   . Neuromuscular disorder ([place])   . Obesity       Family History  Problem Relation Age of Onset  . Cancer Mother 77       unknown primary  . Early death Mother   . Diabetes Father   . Hearing loss Father   . Diabetes Brother   . Alcohol abuse Paternal Grandfather     Social History   Social History Narrative   Married for 4 years,3rd marriage.Delivery driver,currently furloughed.Ex-Police Officer.Looking for other employment.     Current Meds  Medication Sig  . allopurinol (ZYLOPRIM) 300 MG tablet TAKE 1 TABLET BY MOUTH DAILY  . Ascorbic Acid (VITAMIN C PO) Take by mouth.  . Cholecalciferol (VITAMIN D3) 125 MCG (5000 UT) TABS Take 2 tablets by mouth daily.  Marland Kitchen gabapentin (NEURONTIN) 300 MG capsule Take 300 mg by mouth 3 (three) times daily.  . magnesium 30 MG tablet Take 1,200 mg by mouth at bedtime.   . Multiple Vitamin (MULTIVITAMIN) capsule Take 1 capsule by mouth daily.  . Multiple Vitamins-Minerals (ZINC PO) Take by mouth.  . NP THYROID 90 MG tablet Take 90 mg by mouth daily.  . Potassium Gluconate 550 MG TABS Take by mouth.  . Testosterone Cypionate 200 MG/ML SOLN Inject 120  mg as directed 2 (two) times a week.     Nutrition  Variable, not consistent. Sleep  Adequate.  Exercise  Not consistent because he has had foot surgery. Bio Identical Hormones  Testosterone therapy is being used off label for symptoms of testosterone deficiency and benefits that it produces based on several studies.  These benefits include decreasing body fat, increasing in lean muscle mass and increasing in bone density.  There is improvement of memory, cognition.  There is improvement in exercise tolerance and endurance.  Testosterone therapy has also been shown to be protective against coronary artery disease, cerebrovascular disease, diabetes, hypertension and degenerative joint disease. I have discussed with the patient the FDA warnings regarding testosterone therapy, benefits and side effects and modes of administration as well as monitoring blood levels and side effects  on a regular basis The patient is agreeable that testosterone therapy should be an integral part of his/her wellness,quality of life and prevention of chronic disease.  This patient is being treated with desiccated thyroid, off label, for symptoms of thyroid deficiency.  The patient has been counseled regarding side effects and how to deal with them.  Objective:   Today's Vitals: BP 120/82   Pulse 72   Ht 5\' 11"  (1.803 m)   Wt 249 lb (112.9 kg)   BMI 34.73 kg/m  Vitals with BMI 07/31/2019 03/19/2019 08/05/2016  Height 5\' 11"  - 5\' 11"   Weight 249 lbs - 245 lbs  BMI 34.74 - 34.2  Systolic 120 115  Diastolic 82 76 78  Pulse 72 82 81     Physical Exam     He looks systemically well.  He has lost 6-7 pounds since last visit.  Assessment   1. Hypogonadism in male   2. Fatty liver disease, nonalcoholic   3. Class 2 obesity due to excess calories without serious comorbidity with body mass index (BMI) of 35.0 to 35.9 in adult       Tests ordered Orders Placed This Encounter  Procedures  .  T3, free  . TSH  . Testosterone Total,Free,Bio, Males     Plan: 1. He will continue with the higher dose of testosterone therapy and I will check levels today. 2. I will check thyroid levels today and see if we need to adjust upwards or continue the same dose. 3. He will work on diet and we discussed intermittent fasting combined with a whole food plant-based diet.  He will try to do better. 4. Further recommendations will depend on blood tests and I will see him in 4 months time for follow-up. 5. Today I spent 25 to 30 minutes with this patient face-to-face, more than 50% of the time was involved in discussing nutrition above.   No orders of the defined types were placed in this encounter.   , MD

## 2019-08-01 LAB — T3, FREE: T3, Free: 3.9 pg/mL (ref 2.3–4.2)

## 2019-08-01 LAB — TESTOSTERONE TOTAL,FREE,BIO, MALES
Albumin: 4.6 g/dL (ref 3.6–5.1)
Sex Hormone Binding: 21 nmol/L (ref 10–50)
Testosterone, Bioavailable: 1016.1 ng/dL — ABNORMAL HIGH (ref >=110.0)
Testosterone, Free: 483.8 pg/mL — ABNORMAL HIGH (ref 46.0–224.0)
Testosterone: 1775 ng/dL — ABNORMAL HIGH (ref 250–827)

## 2019-08-01 LAB — TSH: TSH: 0.63 mIU/L (ref 0.40–4.50)

## 2019-08-02 ENCOUNTER — Encounter (INDEPENDENT_AMBULATORY_CARE_PROVIDER_SITE_OTHER): Payer: Self-pay | Admitting: Internal Medicine

## 2019-08-12 ENCOUNTER — Other Ambulatory Visit (INDEPENDENT_AMBULATORY_CARE_PROVIDER_SITE_OTHER): Payer: Self-pay | Admitting: Internal Medicine

## 2019-08-14 ENCOUNTER — Encounter (INDEPENDENT_AMBULATORY_CARE_PROVIDER_SITE_OTHER): Payer: Self-pay | Admitting: Internal Medicine

## 2019-09-22 ENCOUNTER — Other Ambulatory Visit (INDEPENDENT_AMBULATORY_CARE_PROVIDER_SITE_OTHER): Payer: Self-pay | Admitting: Internal Medicine

## 2019-10-24 ENCOUNTER — Other Ambulatory Visit (INDEPENDENT_AMBULATORY_CARE_PROVIDER_SITE_OTHER): Payer: Self-pay | Admitting: Internal Medicine

## 2019-10-28 ENCOUNTER — Other Ambulatory Visit (INDEPENDENT_AMBULATORY_CARE_PROVIDER_SITE_OTHER): Payer: Self-pay | Admitting: Internal Medicine

## 2019-10-28 ENCOUNTER — Telehealth (INDEPENDENT_AMBULATORY_CARE_PROVIDER_SITE_OTHER): Payer: Self-pay | Admitting: Internal Medicine

## 2019-10-28 MED ORDER — TESTOSTERONE CYPIONATE 200 MG/ML IJ SOLN: 120.0000 mg | INTRAMUSCULAR | 1 refills | Status: DC

## 2019-10-28 NOTE — Telephone Encounter (Signed)
Done

## 2019-12-05 ENCOUNTER — Ambulatory Visit (INDEPENDENT_AMBULATORY_CARE_PROVIDER_SITE_OTHER): Payer: No Typology Code available for payment source | Admitting: Internal Medicine

## 2019-12-05 ENCOUNTER — Encounter (INDEPENDENT_AMBULATORY_CARE_PROVIDER_SITE_OTHER): Payer: Self-pay | Admitting: Internal Medicine

## 2019-12-05 ENCOUNTER — Other Ambulatory Visit: Payer: Self-pay

## 2019-12-05 DIAGNOSIS — Z6835 Body mass index (BMI) 35.0-35.9, adult: Secondary | ICD-10-CM | POA: Diagnosis not present

## 2019-12-05 DIAGNOSIS — E6609 Other obesity due to excess calories: Secondary | ICD-10-CM

## 2019-12-05 DIAGNOSIS — K760 Fatty (change of) liver, not elsewhere classified: Secondary | ICD-10-CM

## 2019-12-05 DIAGNOSIS — E291 Testicular hypofunction: Secondary | ICD-10-CM | POA: Diagnosis not present

## 2019-12-05 NOTE — Progress Notes (Signed)
Metrics: Intervention Frequency ACO  Documented Smoking Status Yearly  Screened one or more times in 24 months  Cessation Counseling or  Active cessation medication Past 24 months  Past 24 months   Guideline developer: UpToDate (See UpToDate for funding source) Date Released: 2014       Wellness Office Visit  Subjective:  Patient ID: Victor Murray, male    DOB: 03-08-1966  Age: 54 y.o. MRN: [id number]  CC: This is an audio visit with the patient permission who is at home and I am in my home.  I was able to easily recognize his voice. This visit is to follow-up on his chronic conditions including fatty liver disease, hypogonadism, hyperlipidemia.  HPI  Overall, he is doing reasonably well.  He continues his chronic medications which are described below.  He denies any chest pain, dyspnea, palpitations or limb weakness. He has found it challenging to lose weight and he says his weight is maintaining at the present time and not gaining.  We checked his blood work last year and it was stable. Past Medical History:  Diagnosis Date  . Fatty liver disease, nonalcoholic   . Gout   . Hypertriglyceridemia   . Hypogonadism in male   . Neuromuscular disorder (HCC)   . Obesity       Family History  Problem Relation Age of Onset  . Cancer Mother 32       unknown primary  . Early death Mother   . Diabetes Father   . Hearing loss Father   . Diabetes Brother   . Alcohol abuse Paternal Grandfather     Social History   Social History Narrative   Married for 4 years,3rd marriage.Delivery driver,currently furloughed.Ex-Police Officer.Looking for other employment.   Social History   Tobacco Use  . Smoking status: Former Smoker    Quit date: 10/10/2010    Years since quitting: 9.1  . Smokeless tobacco: Never Used  Substance Use Topics  . Alcohol use: No    Alcohol/week: 0.0 standard drinks    Current Meds  Medication Sig  . allopurinol (ZYLOPRIM) 300 MG tablet TAKE 1 TABLET BY  MOUTH DAILY  . Ascorbic Acid (VITAMIN C PO) Take by mouth.  . Cholecalciferol (VITAMIN D3) 125 MCG (5000 UT) TABS Take 2 tablets by mouth daily.  . Diclofenac (ZORVOLEX) 35 MG CAPS Take 1 capsule by mouth 3 (three) times daily.  Marland Kitchen gabapentin (NEURONTIN) 300 MG capsule Take 300 mg by mouth 2 (two) times daily.   . magnesium 30 MG tablet Take 1,200 mg by mouth at bedtime.   . Multiple Vitamin (MULTIVITAMIN) capsule Take 1 capsule by mouth daily.  . Multiple Vitamins-Minerals (ZINC PO) Take by mouth.  . NP THYROID 90 MG tablet TAKE 1 TABLET BY MOUTH DAILY  . Potassium Gluconate 550 MG TABS Take by mouth.  . Testosterone Cypionate 200 MG/ML SOLN Inject 120 mg as directed 2 (two) times a week.      Objective:   Today's Vitals: There were no vitals taken for this visit. Vitals with BMI 12/05/2019 07/31/2019 03/19/2019  Height (No Data) 5\' 11"  -  Weight (No Data) 249 lbs -  BMI - 34.74 -  Systolic (No Data) 120 115  Diastolic (No Data) 82 76  Pulse - 72 82     Physical Exam   His speech is normal on the phone and he appears to be alert and orientated.    Assessment   1. Hypogonadism in male   2.  Fatty liver disease, nonalcoholic   3. Class 2 obesity due to excess calories without serious comorbidity with body mass index (BMI) of 35.0 to 35.9 in adult       Tests ordered No orders of the defined types were placed in this encounter.    Plan: 1. He will continue with all medications for his chronic conditions above. 2. We also discussed COVID-19 vaccine and I discussed the science behind messenger RNA technology.  He was curious about this. 3. I will see him in 3 months time for follow-up and we will see how he is doing and get blood work done. 4. This phone call lasted 20 minutes.   No orders of the defined types were placed in this encounter.   Doree Albee, MD

## 2019-12-14 ENCOUNTER — Encounter (INDEPENDENT_AMBULATORY_CARE_PROVIDER_SITE_OTHER): Payer: Self-pay | Admitting: Internal Medicine

## 2019-12-16 NOTE — Telephone Encounter (Signed)
What would you like to do at this time?

## 2019-12-21 DIAGNOSIS — Z8639 Personal history of other endocrine, nutritional and metabolic disease: Secondary | ICD-10-CM | POA: Insufficient documentation

## 2020-01-02 ENCOUNTER — Other Ambulatory Visit (INDEPENDENT_AMBULATORY_CARE_PROVIDER_SITE_OTHER): Payer: Self-pay

## 2020-01-02 ENCOUNTER — Other Ambulatory Visit (INDEPENDENT_AMBULATORY_CARE_PROVIDER_SITE_OTHER): Payer: Self-pay | Admitting: Internal Medicine

## 2020-01-02 MED ORDER — TESTOSTERONE CYPIONATE 200 MG/ML IJ SOLN: 120.0000 mg | INTRAMUSCULAR | 1 refills | Status: DC

## 2020-01-02 NOTE — Telephone Encounter (Signed)
Please change to [employer]

## 2020-01-02 NOTE — Telephone Encounter (Signed)
We need to send to [employer].

## 2020-03-05 ENCOUNTER — Other Ambulatory Visit: Payer: Self-pay

## 2020-03-05 ENCOUNTER — Ambulatory Visit (INDEPENDENT_AMBULATORY_CARE_PROVIDER_SITE_OTHER): Payer: No Typology Code available for payment source | Admitting: Internal Medicine

## 2020-03-05 ENCOUNTER — Encounter (INDEPENDENT_AMBULATORY_CARE_PROVIDER_SITE_OTHER): Payer: Self-pay | Admitting: Internal Medicine

## 2020-03-05 VITALS — BP 110/65 | HR 95 | Temp 98.2°F | Ht 71.0 in | Wt 255.2 lb

## 2020-03-05 DIAGNOSIS — E6609 Other obesity due to excess calories: Secondary | ICD-10-CM | POA: Diagnosis not present

## 2020-03-05 DIAGNOSIS — K760 Fatty (change of) liver, not elsewhere classified: Secondary | ICD-10-CM

## 2020-03-05 DIAGNOSIS — Z6835 Body mass index (BMI) 35.0-35.9, adult: Secondary | ICD-10-CM

## 2020-03-05 DIAGNOSIS — E291 Testicular hypofunction: Secondary | ICD-10-CM

## 2020-03-05 MED ORDER — TESTOSTERONE CYPIONATE 200 MG/ML IJ SOLN: 120.0000 mg | INTRAMUSCULAR | 1 refills | Status: DC

## 2020-03-05 NOTE — Patient Instructions (Signed)
[facility] Dietary Recommendations for Weight Loss What to Avoid . Avoid added sugars o Often added sugar can be found in processed foods such as many condiments, dry cereals, cakes, cookies, chips, crisps, crackers, candies, sweetened drinks, etc.  o Read labels and AVOID/DECREASE use of foods with the following in their ingredient list: Sugar, fructose, high fructose corn syrup, sucrose, glucose, maltose, dextrose, molasses, cane sugar, brown sugar, any type of syrup, agave nectar, etc.   . Avoid snacking in between meals . Avoid foods made with flour o If you are going to eat food made with flour, choose those made with whole-grains; and, minimize your consumption as much as is tolerable . Avoid processed foods o These foods are generally stocked in the middle of the grocery store. Focus on shopping on the perimeter of the grocery.  . Avoid Meat  o We recommend following a plant-based diet at [facility]. Thus, we recommend avoiding meat as a general rule. Consider eating beans, legumes, eggs, and/or dairy products for regular protein sources o If you plan on eating meat limit to 4 ounces of meat at a time and choose lean options such as Fish, chicken, turkey. Avoid red meat intake such as pork and/or steak What to Include . Vegetables o GREEN LEAFY VEGETABLES: Kale, spinach, mustard greens, collard greens, cabbage, broccoli, etc. o OTHER: Asparagus, cauliflower, eggplant, carrots, peas, Brussel sprouts, tomatoes, bell peppers, zucchini, beets, cucumbers, etc. . Grains, seeds, and legumes o Beans: kidney beans, black eyed peas, garbanzo beans, black beans, pinto beans, etc. o Whole, unrefined grains: brown rice, barley, bulgur, oatmeal, etc. . Healthy fats  o Avoid highly processed fats such as vegetable oil o Examples of healthy fats: avocado, olives, virgin olive oil, dark chocolate (?72% Cocoa), nuts (peanuts, almonds, walnuts, cashews, pecans, etc.) . None to Low  Intake of Animal Sources of Protein o Meat sources: chicken, turkey, salmon, tuna. Limit to 4 ounces of meat at one time. o Consider limiting dairy sources, but when choosing dairy focus on: PLAIN Greek yogurt, cottage cheese, high-protein milk . Fruit o Choose berries  When to Eat . Intermittent Fasting: o Choosing not to eat for a specific time period, but DO FOCUS ON HYDRATION when fasting o Multiple Techniques: - Time Restricted Eating: eat 3 meals in a day, each meal lasting no more than 60 minutes, no snacks between meals - 16-18 hour fast: fast for 16 to 18 hours up to 7 days a week. Often suggested to start with 2-3 nonconsecutive days per week.  . Remember the time you sleep is counted as fasting.  . Examples of eating schedule: Fast from 7:00pm-11:00am. Eat between 11:00am-7:00pm.  - 24-hour fast: fast for 24 hours up to every other day. Often suggested to start with 1 day per week . Remember the time you sleep is counted as fasting . Examples of eating schedule:  o Eating day: eat 2-3 meals on your eating day. If doing 2 meals, each meal should last no more than 90 minutes. If doing 3 meals, each meal should last no more than 60 minutes. Finish last meal by 7:00pm. o Fasting day: Fast until 7:00pm.  o IF YOU FEEL UNWELL FOR ANY REASON/IN ANY WAY WHEN FASTING, STOP FASTING BY EATING A NUTRITIOUS SNACK OR LIGHT MEAL o ALWAYS FOCUS ON HYDRATION DURING FASTS - Acceptable Hydration sources: water, broths, tea/coffee (black tea/coffee is best but using a small amount of whole-fat dairy products in coffee/tea is acceptable).  -   Poor Hydration Sources: anything with sugar or artificial sweeteners added to it  These recommendations have been developed for patients that are actively receiving medical care from either Dr. Arnie Maiolo or Sarah Gray, DNP, NP-C at [facility]. These recommendations are developed for patients with specific medical conditions and are not meant to be  distributed or used by others that are not actively receiving care from either provider listed above at [facility]. It is not appropriate to participate in the above eating plans without proper medical supervision.   Reference: Fung, J. The obesity code. [place]: [place]; 2016.   

## 2020-03-05 NOTE — Progress Notes (Signed)
Metrics: Intervention Frequency ACO  Documented Smoking Status Yearly  Screened one or more times in 24 months  Cessation Counseling or  Active cessation medication Past 24 months  Past 24 months   Guideline developer: UpToDate (See UpToDate for funding source) Date Released: 2014       Wellness Office Visit  Subjective:  Patient ID: Victor Murray, male    DOB: Dec 29, 1965  Age: 54 y.o. MRN: [id number]  CC: This man comes in for follow-up of fatty liver disease, obesity, symptoms related to low testosterone level and symptoms related to thyroid deficiency. HPI  He is doing reasonably well.  He continues to work on Journalist, newspaper.  He continues to have challenges with nutrition. He is currently not exercising and does not appear to have the time for exercise because of his work. He continues to take testosterone therapy as before.  He feels that this is a good dose for him. He continues to take NP thyroid for symptoms of thyroid deficiency. Past Medical History:  Diagnosis Date  . Fatty liver disease, nonalcoholic   . Gout   . Hypertriglyceridemia   . Hypogonadism in male   . Neuromuscular disorder (HCC)   . Obesity    Past Surgical History:  Procedure Laterality Date  . cervical fusion     Dr. Jeral Fruit  . SPINE SURGERY       Family History  Problem Relation Age of Onset  . Cancer Mother 69       unknown primary  . Early death Mother   . Diabetes Father   . Hearing loss Father   . Diabetes Brother   . 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   Social History   Tobacco Use  . Smoking status: Former Smoker    Quit date: 10/10/2010    Years since quitting: 9.4  . Smokeless tobacco: Never Used  Substance Use Topics  . Alcohol use: No    Alcohol/week: 0.0 standard drinks    Current Meds  Medication Sig  . allopurinol (ZYLOPRIM) 300 MG  tablet TAKE 1 TABLET BY MOUTH DAILY  . Ascorbic Acid (VITAMIN C PO) Take by mouth.  . Cholecalciferol (VITAMIN D3) 125 MCG (5000 UT) TABS Take 2 tablets by mouth daily.  . Diclofenac (ZORVOLEX) 35 MG CAPS Take 1 capsule by mouth 3 (three) times daily.  Marland Kitchen gabapentin (NEURONTIN) 300 MG capsule Take 300 mg by mouth 2 (two) times daily.   . Magnesium 400 MG CAPS Take 1,200 mg by mouth at bedtime.   . Multiple Vitamin (MULTIVITAMIN) capsule Take 1 capsule by mouth daily.  . Multiple Vitamins-Minerals (ZINC PO) Take by mouth.  . NP THYROID 90 MG tablet TAKE 1 TABLET BY MOUTH DAILY  . Potassium Gluconate 550 MG TABS Take by mouth.  . pyridOXINE (VITAMIN B-6) 100 MG tablet Take 100 mg by mouth daily.  . Testosterone Cypionate 200 MG/ML SOLN Inject 120 mg as directed 2 (two) times a week.  . [DISCONTINUED] Testosterone Cypionate 200 MG/ML SOLN Inject 120 mg as directed 2 (two) times a week.       No flowsheet data found.   Objective:   Today's Vitals: BP 110/65 (BP Location: Left Arm, Patient Position: Sitting, Cuff Size: Normal)   Pulse 95   Temp 98.2 F (36.8 C) (Temporal)   Ht 5\' 11"  (1.803 m)   Wt 255 lb 3.2 oz (  115.8 kg)   SpO2 97%   BMI 35.59 kg/m  Vitals with BMI 03/05/2020 12/05/2019 07/31/2019  Height 5\' 11"  (No Data) 5\' 11"   Weight 255 lbs 3 oz (No Data) 249 lbs  BMI 93.26 - 71.24  Systolic 580 (No Data) 998  Diastolic 65 (No Data) 82  Pulse 95 - 72     Physical Exam   He looks systemically well.  He remains obese.    Assessment   1. Fatty liver disease, nonalcoholic   2. Hypogonadism in male   3. Class 2 obesity due to excess calories without serious comorbidity with body mass index (BMI) of 35.0 to 35.9 in adult       Tests ordered No orders of the defined types were placed in this encounter.    Plan: 1. We discussed nutrition again and the concept of intermittent fasting which she admittedly has not done on a regular basis and also a more of a  plant-based diet.  We have discussed these before but I wanted to reinforce it again. 2. I think we can optimize his thyroid further and I have told him to take a dose of NP thyroid 120 mg every day.  He has tablets which would make up this dose and if he tolerates it, he will let me know through my chart and I will send a new prescription for NP thyroid 120 mg tablet, 1 daily. 3. He will continue with testosterone therapy as before and I have sent a new prescription. 4. I will see him in 6 weeks time to see how he is doing and we will check thyroid levels again. 5. Today I spent 30 minutes with this patient discussing his overall health, nutrition and also spent some time discussing exercise meditation.    Meds ordered this encounter  Medications  . Testosterone Cypionate 200 MG/ML SOLN    Sig: Inject 120 mg as directed 2 (two) times a week.    Dispense:  10 mL    Refill:  1    Carolyne Whitsel Luther Parody, MD

## 2020-03-19 ENCOUNTER — Other Ambulatory Visit (INDEPENDENT_AMBULATORY_CARE_PROVIDER_SITE_OTHER): Payer: Self-pay | Admitting: Internal Medicine

## 2020-03-20 ENCOUNTER — Other Ambulatory Visit (INDEPENDENT_AMBULATORY_CARE_PROVIDER_SITE_OTHER): Payer: Self-pay | Admitting: Nurse Practitioner

## 2020-04-16 ENCOUNTER — Other Ambulatory Visit: Payer: Self-pay

## 2020-04-16 ENCOUNTER — Ambulatory Visit (INDEPENDENT_AMBULATORY_CARE_PROVIDER_SITE_OTHER): Payer: Self-pay | Admitting: Internal Medicine

## 2020-04-16 ENCOUNTER — Encounter (INDEPENDENT_AMBULATORY_CARE_PROVIDER_SITE_OTHER): Payer: Self-pay | Admitting: Internal Medicine

## 2020-04-16 VITALS — BP 140/85 | HR 86 | Temp 97.9°F | Ht 71.0 in | Wt 252.8 lb

## 2020-04-16 DIAGNOSIS — Z6835 Body mass index (BMI) 35.0-35.9, adult: Secondary | ICD-10-CM

## 2020-04-16 DIAGNOSIS — E6609 Other obesity due to excess calories: Secondary | ICD-10-CM

## 2020-04-16 DIAGNOSIS — K760 Fatty (change of) liver, not elsewhere classified: Secondary | ICD-10-CM

## 2020-04-16 DIAGNOSIS — E291 Testicular hypofunction: Secondary | ICD-10-CM

## 2020-04-16 DIAGNOSIS — M1A0790 Idiopathic chronic gout, unspecified ankle and foot, without tophus (tophi): Secondary | ICD-10-CM

## 2020-04-16 MED ORDER — ALLOPURINOL 300 MG PO TABS: 300.0000 mg | ORAL_TABLET | Freq: Every day | ORAL | 1 refills | Status: DC

## 2020-04-16 MED ORDER — TESTOSTERONE CYPIONATE 200 MG/ML IJ SOLN: 120.0000 mg | INTRAMUSCULAR | 1 refills | Status: DC

## 2020-04-16 MED ORDER — THYROID 120 MG PO TABS: 120.0000 mg | ORAL_TABLET | Freq: Every day | ORAL | 1 refills | Status: DC

## 2020-04-16 NOTE — Progress Notes (Signed)
Metrics: Intervention Frequency ACO  Documented Smoking Status Yearly  Screened one or more times in 24 months  Cessation Counseling or  Active cessation medication Past 24 months  Past 24 months   Guideline developer: UpToDate (See UpToDate for funding source) Date Released: 2014       Wellness Office Visit  Subjective:  Patient ID: Victor [facility], male    DOB: 29-Mar-1966  Age: 54 y.o. MRN: [id number]  CC: This man comes in for follow-up of hypogonadism, fatty liver disease, obesity. HPI  He is trying to make some changes in a constructive manner regarding his diet.  He feels that his wife is not on the same page as him at the present time but he will work on it.  He also has changed his job and left it 2 weeks ago and wants to go full-time and to [employer].  This will give him more time to exercise and try to be healthier. He also suffers from gout and requires refill on his allopurinol. He has tolerated NP thyroid at a higher dose compared to last visit and he seems to have more energy with this. Past Medical History:  Diagnosis Date  . Fatty liver disease, nonalcoholic   . Gout   . Hypertriglyceridemia   . Hypogonadism in male   . Neuromuscular disorder (HCC)   . Obesity    Past Surgical History:  Procedure Laterality Date  . cervical fusion     Dr. Jeral Fruit  . SPINE SURGERY       Family History  Problem Relation Age of Onset  . Cancer Mother 31       unknown primary  . Early death Mother   . Diabetes Father   . Hearing loss Father   . Diabetes Brother   . 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   Social History   Tobacco Use  . Smoking status: Former Smoker    Quit date: 10/10/2010    Years since quitting: 9.5  . Smokeless tobacco: Never Used  Substance Use Topics  . Alcohol use: No    Alcohol/week:  0.0 standard drinks    Current Meds  Medication Sig  . allopurinol (ZYLOPRIM) 300 MG tablet Take 1 tablet (300 mg total) by mouth daily.  . Ascorbic Acid (VITAMIN C PO) Take by mouth.  . Cholecalciferol (VITAMIN D3) 125 MCG (5000 UT) TABS Take 2 tablets by mouth daily.  . Diclofenac (ZORVOLEX) 35 MG CAPS Take 1 capsule by mouth 3 (three) times daily.  Marland Kitchen gabapentin (NEURONTIN) 300 MG capsule Take 300 mg by mouth 2 (two) times daily.   . Magnesium 400 MG CAPS Take 1,200 mg by mouth at bedtime.   . Multiple Vitamin (MULTIVITAMIN) capsule Take 1 capsule by mouth daily.  . Multiple Vitamins-Minerals (ZINC PO) Take by mouth.  . Potassium Gluconate 550 MG TABS Take by mouth.  . pyridOXINE (VITAMIN B-6) 100 MG tablet Take 100 mg by mouth daily.  . Testosterone Cypionate 200 MG/ML SOLN Inject 120 mg as directed 2 (two) times a week.  . thyroid (NP THYROID) 120 MG tablet Take 1 tablet (120 mg total) by mouth daily before breakfast.  . [DISCONTINUED] allopurinol (ZYLOPRIM) 300 MG tablet TAKE 1 TABLET BY MOUTH DAILY  . [DISCONTINUED] Testosterone Cypionate 200 MG/ML SOLN Inject 120 mg as directed 2 (two) times a week.  . [DISCONTINUED]  thyroid (NP THYROID) 120 MG tablet Take 120 mg by mouth daily before breakfast.       No flowsheet data found.   Objective:   Today's Vitals: BP 140/85 (BP Location: Left Arm, Patient Position: Sitting, Cuff Size: Normal)   Pulse 86   Temp 97.9 F (36.6 C) (Temporal)   Ht 5\' 11"  (1.803 m)   Wt 252 lb 12.8 oz (114.7 kg)   SpO2 96%   BMI 35.26 kg/m  Vitals with BMI 04/16/2020 03/05/2020 12/05/2019  Height 5\' 11"  5\' 11"  (No Data)  Weight 252 lbs 13 oz 255 lbs 3 oz (No Data)  BMI 35.27 35.61 -  Systolic 140 110 (No Data)  Diastolic 85 65 (No Data)  Pulse 86 95 -     Physical Exam  He looks systemically well, remains obese but he has lost 3 pounds since the last visit.  Blood pressure borderline elevated today.     Assessment   1. Hypogonadism in  male   2. Fatty liver disease, nonalcoholic   3. Class 2 obesity due to excess calories without serious comorbidity with body mass index (BMI) of 35.0 to 35.9 in adult   4. Idiopathic chronic gout of foot without tophus, unspecified laterality       Tests ordered No orders of the defined types were placed in this encounter.    Plan: 1. I have refilled his allopurinol for his gout which appears to be fairly stable. 2. I reviewed and refilled his desiccated NP thyroid which is being used off label for symptoms of thyroid deficiency.  He has tolerated the higher dose of NP thyroid compared to the last visit. 3. I have refilled his testosterone for his hypogonadism and he is very stable on this. 4. He will continue to work on nutrition.  We will start exercise on a regular basis. 5. Follow-up in about 3 months time and we will do blood work at that time.   Meds ordered this encounter  Medications  . allopurinol (ZYLOPRIM) 300 MG tablet    Sig: Take 1 tablet (300 mg total) by mouth daily.    Dispense:  90 tablet    Refill:  1  . thyroid (NP THYROID) 120 MG tablet    Sig: Take 1 tablet (120 mg total) by mouth daily before breakfast.    Dispense:  90 tablet    Refill:  1  . Testosterone Cypionate 200 MG/ML SOLN    Sig: Inject 120 mg as directed 2 (two) times a week.    Dispense:  10 mL    Refill:  1    Lamir Racca 12/07/2019, MD

## 2020-07-20 ENCOUNTER — Telehealth (INDEPENDENT_AMBULATORY_CARE_PROVIDER_SITE_OTHER): Payer: Self-pay | Admitting: Internal Medicine

## 2020-07-20 ENCOUNTER — Other Ambulatory Visit (INDEPENDENT_AMBULATORY_CARE_PROVIDER_SITE_OTHER): Payer: Self-pay | Admitting: Internal Medicine

## 2020-07-20 MED ORDER — TESTOSTERONE CYPIONATE 200 MG/ML IJ SOLN: 120.0000 mg | INTRAMUSCULAR | 1 refills | Status: DC

## 2020-07-20 MED ORDER — THYROID 120 MG PO TABS: 120.0000 mg | ORAL_TABLET | Freq: Every day | ORAL | 1 refills | Status: DC

## 2020-07-20 NOTE — Telephone Encounter (Signed)
Done

## 2020-07-23 ENCOUNTER — Ambulatory Visit (INDEPENDENT_AMBULATORY_CARE_PROVIDER_SITE_OTHER): Payer: Self-pay | Admitting: Internal Medicine

## 2020-07-23 ENCOUNTER — Other Ambulatory Visit: Payer: Self-pay

## 2020-07-23 ENCOUNTER — Encounter (INDEPENDENT_AMBULATORY_CARE_PROVIDER_SITE_OTHER): Payer: Self-pay | Admitting: Internal Medicine

## 2020-07-23 VITALS — BP 114/64 | HR 94 | Temp 97.3°F | Ht 71.0 in | Wt 252.0 lb

## 2020-07-23 DIAGNOSIS — Z6835 Body mass index (BMI) 35.0-35.9, adult: Secondary | ICD-10-CM

## 2020-07-23 DIAGNOSIS — E6609 Other obesity due to excess calories: Secondary | ICD-10-CM

## 2020-07-23 DIAGNOSIS — E782 Mixed hyperlipidemia: Secondary | ICD-10-CM

## 2020-07-23 DIAGNOSIS — E291 Testicular hypofunction: Secondary | ICD-10-CM

## 2020-07-23 DIAGNOSIS — K760 Fatty (change of) liver, not elsewhere classified: Secondary | ICD-10-CM

## 2020-07-23 NOTE — Progress Notes (Signed)
Metrics: Intervention Frequency ACO  Documented Smoking Status Yearly  Screened one or more times in 24 months  Cessation Counseling or  Active cessation medication Past 24 months  Past 24 months   Guideline developer: UpToDate (See UpToDate for funding source) Date Released: 2014       Wellness Office Visit  Subjective:  Patient ID: Victor [facility], male    DOB: Dec 03, 1965  Age: 54 y.o. MRN: [id number]  CC: This man comes in for follow-up of fatty liver disease, obesity, gout, hypertriglyceridemia and symptoms of thyroid hypofunction. HPI  He has not started the higher dose of NP thyroid yet and he will do so now. He is currently taking NP thyroid 90 mg daily. He continues on testosterone therapy as before. He continues to try to be vigilant about his nutrition. Past Medical History:  Diagnosis Date  . Fatty liver disease, nonalcoholic   . Gout   . Hypertriglyceridemia   . Hypogonadism in male   . Neuromuscular disorder (HCC)   . Obesity    Past Surgical History:  Procedure Laterality Date  . cervical fusion     Dr. Jeral Fruit  . SPINE SURGERY       Family History  Problem Relation Age of Onset  . Cancer Mother 86       unknown primary  . Early death Mother   . Diabetes Father   . Hearing loss Father   . Diabetes Brother   . 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   Social History   Tobacco Use  . Smoking status: Former Smoker    Quit date: 10/10/2010    Years since quitting: 9.7  . Smokeless tobacco: Never Used  Substance Use Topics  . Alcohol use: No    Alcohol/week: 0.0 standard drinks    Current Meds  Medication Sig  . Ascorbic Acid (VITAMIN C PO) Take by mouth.  . Cholecalciferol (VITAMIN D3) 125 MCG (5000 UT) TABS Take 2 tablets by mouth daily.  . Diclofenac (ZORVOLEX) 35 MG CAPS Take 1 capsule by mouth 3 (three) times  daily.  Marland Kitchen gabapentin (NEURONTIN) 300 MG capsule Take 300 mg by mouth 2 (two) times daily.   . Magnesium 400 MG CAPS Take 1,200 mg by mouth at bedtime.   . Multiple Vitamin (MULTIVITAMIN) capsule Take 1 capsule by mouth daily.  . Multiple Vitamins-Minerals (ZINC PO) Take by mouth.  . Potassium Gluconate 550 MG TABS Take by mouth.  . pyridOXINE (VITAMIN B-6) 100 MG tablet Take 100 mg by mouth daily.  . Testosterone Cypionate 200 MG/ML SOLN Inject 120 mg as directed 2 (two) times a week.  . thyroid (NP THYROID) 120 MG tablet Take 1 tablet (120 mg total) by mouth daily before breakfast.      No flowsheet data found.   Objective:   Today's Vitals: BP 114/64   Pulse 94   Temp (!) 97.3 F (36.3 C) (Temporal)   Ht 5\' 11"  (1.803 m)   Wt 252 lb (114.3 kg)   SpO2 94%   BMI 35.15 kg/m  Vitals with BMI 07/23/2020 04/16/2020 03/05/2020  Height 5\' 11"  5\' 11"  5\' 11"   Weight 252 lbs 252 lbs 13 oz 255 lbs 3 oz  BMI 35.16 35.27 35.61  Systolic 114 140 03/07/2020  Diastolic 64 85 65  Pulse 94 86 95     Physical Exam   He looks  systemically well. His weight is holding steady and thankfully has not gained any weight.    Assessment   1. Fatty liver disease, nonalcoholic   2. Hypogonadism in male   3. Mixed hyperlipidemia   4. Class 2 obesity due to excess calories without serious comorbidity with body mass index (BMI) of 35.0 to 35.9 in adult       Tests ordered No orders of the defined types were placed in this encounter.    Plan: 1. I recommended that he start the NP thyroid 120 mg daily. 2. He also feels that he can improve how he feels and he can certainly go up on the testosterone dose and I have instructed him how he should do this gradually. 3. Follow-up in about 4 months time to see how he is doing and we may need to do blood work then.   No orders of the defined types were placed in this encounter.   Wilson Singer, MD

## 2020-09-08 ENCOUNTER — Other Ambulatory Visit (INDEPENDENT_AMBULATORY_CARE_PROVIDER_SITE_OTHER): Payer: Self-pay | Admitting: Internal Medicine

## 2020-09-08 ENCOUNTER — Encounter (INDEPENDENT_AMBULATORY_CARE_PROVIDER_SITE_OTHER): Payer: Self-pay | Admitting: Internal Medicine

## 2020-09-08 MED ORDER — TESTOSTERONE CYPIONATE 200 MG/ML IJ SOLN: 160.0000 mg | INTRAMUSCULAR | 2 refills | Status: DC

## 2020-11-24 ENCOUNTER — Ambulatory Visit (INDEPENDENT_AMBULATORY_CARE_PROVIDER_SITE_OTHER): Payer: Self-pay | Admitting: Internal Medicine

## 2020-11-24 ENCOUNTER — Encounter (INDEPENDENT_AMBULATORY_CARE_PROVIDER_SITE_OTHER): Payer: Self-pay | Admitting: Internal Medicine

## 2020-11-24 ENCOUNTER — Other Ambulatory Visit: Payer: Self-pay

## 2020-11-24 VITALS — BP 140/90 | HR 72 | Ht 71.0 in | Wt 262.0 lb

## 2020-11-24 DIAGNOSIS — E291 Testicular hypofunction: Secondary | ICD-10-CM

## 2020-11-24 DIAGNOSIS — K760 Fatty (change of) liver, not elsewhere classified: Secondary | ICD-10-CM

## 2020-11-24 DIAGNOSIS — Z6835 Body mass index (BMI) 35.0-35.9, adult: Secondary | ICD-10-CM

## 2020-11-24 DIAGNOSIS — E6609 Other obesity due to excess calories: Secondary | ICD-10-CM

## 2020-11-24 NOTE — Progress Notes (Signed)
Metrics: Intervention Frequency ACO  Documented Smoking Status Yearly  Screened one or more times in 24 months  Cessation Counseling or  Active cessation medication Past 24 months  Past 24 months   Guideline developer: UpToDate (See UpToDate for funding source) Date Released: 2014       Wellness Office Visit  Subjective:  Patient ID: Victor Murray, male    DOB: Nov 28, 1965  Age: 55 y.o. MRN: [id number]  CC: This man comes in for follow-up of testosterone therapy fatty liver disease, obesity, vitamin D deficiency. HPI  He continues with testosterone twice a week and also NP thyroid, which is being used off label, for symptoms of thyroid hypofunction. Unfortunately, he has not been eating well and has gained 10 pounds in weight. He has foot pain and because he does not have any health insurance, he is not able to go and see his podiatrist.  He is determined to get insurance now so that he can take care of his foot and therefore start exercising like he used to. 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  This will mean hours sitting in a vehicle. Past Medical History:  Diagnosis Date  . Fatty liver disease, nonalcoholic   . Gout   . Hypertriglyceridemia   . Hypogonadism in male   . Neuromuscular disorder (HCC)   . Obesity    Past Surgical History:  Procedure Laterality Date  . cervical fusion     Dr. Jeral Fruit  . SPINE SURGERY       Family History  Problem Relation Age of Onset  . Cancer Mother 34       unknown primary  . Early death Mother   . Diabetes Father   . Hearing loss Father   . Diabetes Brother   . 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   Social History   Tobacco Use  . Smoking status: Former Smoker    Quit date: 10/10/2010    Years since quitting: 10.1  . Smokeless tobacco: Never Used  Substance Use  Topics  . Alcohol use: No    Alcohol/week: 0.0 standard drinks    Current Meds  Medication Sig  . Ascorbic Acid (VITAMIN C PO) Take by mouth.  . Cholecalciferol (VITAMIN D3) 125 MCG (5000 UT) TABS Take 1 tablet by mouth daily.  . Diclofenac 35 MG CAPS Take 1 capsule by mouth daily.  Marland Kitchen gabapentin (NEURONTIN) 300 MG capsule Take 300 mg by mouth 2 (two) times daily.   . Magnesium 400 MG CAPS Take 1,200 mg by mouth at bedtime.   . Multiple Vitamin (MULTIVITAMIN) capsule Take 1 capsule by mouth daily.  . Multiple Vitamins-Minerals (ZINC PO) Take by mouth.  . Potassium Gluconate 550 MG TABS Take by mouth.  . pyridOXINE (VITAMIN B-6) 100 MG tablet Take 100 mg by mouth daily.  . Testosterone Cypionate 200 MG/ML SOLN Inject 160 mg as directed 2 (two) times a week.  . thyroid (NP THYROID) 120 MG tablet Take 1 tablet (120 mg total) by mouth daily before breakfast.       Objective:   Today's Vitals: BP 140/90   Pulse 72   Ht 5\' 11"  (1.803 m)   Wt 262 lb (118.8 kg)   BMI 36.54 kg/m  Vitals with BMI 11/24/2020 07/23/2020 04/16/2020  Height 5\' 11"  5\' 11"  5\' 11"   Weight 262 lbs 252 lbs 252 lbs 13 oz  BMI 36.56 35.16 35.27  Systolic 140 114 742  Diastolic 90 64 85  Pulse 72 94 86     Physical Exam   He remains obese and has gained 10 pounds in weight.  Blood pressure is also elevated today but he is alert and orientated without any obvious focal neurological signs.    Assessment   1. Hypogonadism in male   2. Fatty liver disease, nonalcoholic   3. Class 2 obesity due to excess calories without serious comorbidity with body mass index (BMI) of 35.0 to 35.9 in adult       Tests ordered No orders of the defined types were placed in this encounter.    Plan: 1. He will continue with testosterone therapy as before. 2. He will continue with NP thyroid as before. 3. He knows that he really needs to be consistent with healthy nutrition that we have discussed many times  before. 4. Follow-up in 6 months.   No orders of the defined types were placed in this encounter.   Wilson Singer, MD

## 2021-02-01 ENCOUNTER — Other Ambulatory Visit (INDEPENDENT_AMBULATORY_CARE_PROVIDER_SITE_OTHER): Payer: Self-pay | Admitting: Internal Medicine

## 2021-02-01 ENCOUNTER — Other Ambulatory Visit (INDEPENDENT_AMBULATORY_CARE_PROVIDER_SITE_OTHER): Payer: Self-pay | Admitting: Nurse Practitioner

## 2021-03-10 ENCOUNTER — Other Ambulatory Visit (INDEPENDENT_AMBULATORY_CARE_PROVIDER_SITE_OTHER): Payer: Self-pay | Admitting: Internal Medicine

## 2021-03-11 ENCOUNTER — Telehealth (INDEPENDENT_AMBULATORY_CARE_PROVIDER_SITE_OTHER): Payer: Self-pay

## 2021-03-11 NOTE — Telephone Encounter (Signed)
Patient called and left a detailed voice message and stated that [facility] told him to call us to get a refill for his testosterone because they told him they faxed [place] a refill request for this but I have not seen this fax.  I looked in his chart and I see that Dr. Karilyn Cota has refilled this prescription today. I called the patient to let him know this was refilled this morning and patient verbalized an understanding.

## 2021-03-18 ENCOUNTER — Ambulatory Visit (INDEPENDENT_AMBULATORY_CARE_PROVIDER_SITE_OTHER): Payer: BLUE CROSS/BLUE SHIELD

## 2021-03-18 ENCOUNTER — Ambulatory Visit (INDEPENDENT_AMBULATORY_CARE_PROVIDER_SITE_OTHER): Payer: BLUE CROSS/BLUE SHIELD | Admitting: Podiatry

## 2021-03-18 ENCOUNTER — Encounter: Payer: Self-pay | Admitting: Podiatry

## 2021-03-18 ENCOUNTER — Other Ambulatory Visit: Payer: Self-pay

## 2021-03-18 DIAGNOSIS — M2041 Other hammer toe(s) (acquired), right foot: Secondary | ICD-10-CM

## 2021-03-21 NOTE — Progress Notes (Signed)
He presents today with a second toe of his right foot that has a thick callus that cracks on the plantar aspect of the foot at the level of the interphalangeal joint distally.  States that it bleeds and he trims it down.  He states that started on his after his hallux rigidus repair.  Objective: Vital signs are stable he is alert and oriented x3 pulses are palpable.  No change in his past medical history medications allergies surgery social history review of systems.  Right foot appears to be relatively normal with exception of the dry fissured skin on the plantar aspect of the bilateral foot particularly right where he walks barefoot the majority of the time.  The distal aspect of the talar does demonstrate a mild hammertoe deformity.  This deformity is resulting in a reactive hyper keratoma to the plantar distal aspect of the toe near the level of the interphalangeal joint.  Which is relatively flexible on manipulation.  Assessment: Hammertoe/mallet toe deformity second right.  Callus deformity.  Plan: At this point we will set him up for a flexor tenotomy of the second digit right foot we will do this in the office 1 afternoon within the next week or so.

## 2021-03-23 ENCOUNTER — Telehealth: Payer: Self-pay | Admitting: *Deleted

## 2021-03-23 NOTE — Telephone Encounter (Signed)
This patient is scheduled for a tenotomy procedure. He won't be able to have nail removals on the same day due to soaking

## 2021-03-23 NOTE — Telephone Encounter (Signed)
Returned call to patient and spoke with wife and gave information that he will not be able to have nail done on same visit.

## 2021-03-23 NOTE — Telephone Encounter (Signed)
Patient is calling and wanted to let doctor know that he would like both great toenail procedures done on his visit tomorrow.

## 2021-03-24 ENCOUNTER — Ambulatory Visit (INDEPENDENT_AMBULATORY_CARE_PROVIDER_SITE_OTHER): Payer: BLUE CROSS/BLUE SHIELD | Admitting: Internal Medicine

## 2021-03-24 ENCOUNTER — Encounter (INDEPENDENT_AMBULATORY_CARE_PROVIDER_SITE_OTHER): Payer: Self-pay | Admitting: Internal Medicine

## 2021-03-24 ENCOUNTER — Other Ambulatory Visit: Payer: Self-pay

## 2021-03-24 VITALS — BP 133/76 | HR 91 | Temp 97.5°F | Resp 17 | Ht 71.0 in | Wt 239.0 lb

## 2021-03-24 DIAGNOSIS — Z131 Encounter for screening for diabetes mellitus: Secondary | ICD-10-CM

## 2021-03-24 DIAGNOSIS — R5383 Other fatigue: Secondary | ICD-10-CM

## 2021-03-24 DIAGNOSIS — E782 Mixed hyperlipidemia: Secondary | ICD-10-CM | POA: Diagnosis not present

## 2021-03-24 DIAGNOSIS — R5381 Other malaise: Secondary | ICD-10-CM | POA: Diagnosis not present

## 2021-03-24 DIAGNOSIS — N5089 Other specified disorders of the male genital organs: Secondary | ICD-10-CM

## 2021-03-24 DIAGNOSIS — K760 Fatty (change of) liver, not elsewhere classified: Secondary | ICD-10-CM

## 2021-03-24 DIAGNOSIS — Z125 Encounter for screening for malignant neoplasm of prostate: Secondary | ICD-10-CM

## 2021-03-24 DIAGNOSIS — E559 Vitamin D deficiency, unspecified: Secondary | ICD-10-CM

## 2021-03-24 DIAGNOSIS — E291 Testicular hypofunction: Secondary | ICD-10-CM | POA: Diagnosis not present

## 2021-03-24 NOTE — Progress Notes (Signed)
Be aware he does notr have shoes on today. He does not wear shoes.  Pt having dull ake in [place] area. Started on 2wk now; thought it would get better . No change. Cancer run in his family.

## 2021-03-24 NOTE — Progress Notes (Signed)
Metrics: Intervention Frequency ACO  Documented Smoking Status Yearly  Screened one or more times in 24 months  Cessation Counseling or  Active cessation medication Past 24 months  Past 24 months   Guideline developer: UpToDate (See UpToDate for funding source) Date Released: 2014       Wellness Office Visit  Subjective:  Patient ID: Victor Murray, male    DOB: 06-07-1966  Age: 55 y.o. MRN: [id number]  CC: This man comes in for follow-up of hypogonadism, hyperlipidemia, fatty liver disease, vitamin D deficiency. HPI  Since last time I saw him in March, he is done extremely well and has lost significant amount of weight by changing his dietary habits.  He is consistent with portion control now. He continues with testosterone therapy once a week. He continues with NP thyroid also. He has been taking vitamin D3 10,000 units daily. Today describes a mass he felt in the left scrotal area and it is a dull ache for the last 2 to 3 weeks. Past Medical History:  Diagnosis Date   Fatty liver disease, nonalcoholic    Gout    Hypertriglyceridemia    Hypogonadism in male    Neuromuscular disorder (HCC)    Obesity    Past Surgical History:  Procedure Laterality Date   cervical fusion     Dr. 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   Social History   Tobacco Use   Smoking status: Former    Pack years: 0.00    Types: Cigarettes    Quit date: 10/10/2010    Years since quitting: 10.4   Smokeless tobacco: Never  Substance Use Topics   Alcohol use: No    Alcohol/week: 0.0 standard drinks    Current Meds  Medication Sig   allopurinol  (ZYLOPRIM) 300 MG tablet TAKE 1 TABLET BY MOUTH DAILY   Ascorbic Acid (VITAMIN C PO) Take by mouth.   Cholecalciferol (VITAMIN D3) 125 MCG (5000 UT) TABS Take 1 tablet by mouth daily.   Diclofenac 35 MG CAPS Take 1 capsule by mouth daily.   gabapentin (NEURONTIN) 300 MG capsule Take 300 mg by mouth 2 (two) times daily.    Magnesium 400 MG CAPS Take 1,200 mg by mouth at bedtime.    Multiple Vitamin (MULTIVITAMIN) capsule Take 1 capsule by mouth daily.   Multiple Vitamins-Minerals (ZINC PO) Take by mouth.   NP THYROID 120 MG tablet TAKE 1 TABLET(120 MG) BY MOUTH DAILY BEFORE BREAKFAST   Potassium Gluconate 550 MG TABS Take by mouth.   pyridOXINE (VITAMIN B-6) 100 MG tablet Take 100 mg by mouth daily.   testosterone cypionate (DEPOTESTOSTERONE CYPIONATE) 200 MG/ML injection Inject 0.8 mLs (160 mg total) into the muscle 2 (two) times a week.       Objective:   Today's Vitals: BP 133/76 (BP Location: Right Arm, Patient Position: Sitting, Cuff Size: Normal)   Pulse 91   Temp (!) 97.5 F (36.4 C) (Temporal)   Resp 17   Ht 5\' 11"  (1.803 m)   Wt 239 lb (108.4 kg)  SpO2 96%   BMI 33.33 kg/m  Vitals with BMI 03/24/2021 11/24/2020 07/23/2020  Height 5\' 11"  5\' 11"  5\' 11"   Weight 239 lbs 262 lbs 252 lbs  BMI 33.35 36.56 35.16  Systolic 133 140  Diastolic 76 90 64  Pulse 91 72 94     Physical Exam  He looks systemically well.  He has lost 23 pounds since the last time he was seen.  This is fantastic.  Examination of his inguinal area does not show any inguinal hernias but there appears to be a 1 cm diameter mass in the left scrotal area.     Assessment   1. Fatty liver disease, nonalcoholic   2. Hypogonadism in male   3. Mixed hyperlipidemia   4. Malaise and fatigue   5. Vitamin D deficiency disease   6. Screening for diabetes mellitus   7. Special screening for malignant neoplasm of prostate   8. Testicular mass       Tests ordered Orders Placed This Encounter   Procedures   Scrotum   CBC   COMPLETE METABOLIC PANEL WITH GFR   Hemoglobin A1c   Lipid panel   T3, free   T4, free   TSH   VITAMIN D 25 Hydroxy (Vit-D Deficiency, Fractures)   PSA, Total with Reflex to PSA, Free   DHEA-sulfate      Plan: 1.  He will continue with testosterone therapy as before. 2.  Blood work is ordered. 3.  I will arrange for him to have an ultrasound of the scrotum for further evaluation of the scrotal mass. 4.  Further recommendations will depend on the results and I will see him in about 6 months time for follow-up.    No orders of the defined types were placed in this encounter.   , MD

## 2021-03-25 ENCOUNTER — Ambulatory Visit: Payer: Self-pay | Admitting: Podiatry

## 2021-03-25 DIAGNOSIS — M205X9 Other deformities of toe(s) (acquired), unspecified foot: Secondary | ICD-10-CM

## 2021-03-25 NOTE — Patient Instructions (Signed)
Leave bandage in place and dry for 4 days, then remove. You may wash foot normally after removal of bandage. DO NOT SOAK FOOT! Dry completely afterwards and may use a bandaid over incision if needed. We will follow up with you in 1 weeks for recheck.  

## 2021-03-27 NOTE — Progress Notes (Signed)
He presents today for his flexor tenotomy second digit of the right foot.  States that it is unchanged.  States that the callus is becoming more more painful all the time.  Objective: Vital signs are stable he is alert oriented x3 mild mallet toe deformity resulting in reactive hyperkeratosis plantar aspect and distal plantar aspect of the second toe right foot.  It is flexible and nonrigid at the DIPJ.  Assessment: Mallet toe deformity early hammertoe deformity.  Plan: At this point we performed a consent today consisting of a flexor tenotomy at the level of the PIPJ right foot.  He signed the consent after we discussed that thoroughly.  He understands and is amendable to it.  We had discussed this previous visit.  I injected 50-50 mixture Marcaine plain and lidocaine with epinephrine around the base of the second toe right foot today rendering good anesthesia to the toe.  The area was prepped and draped in sterile sterile fashion.  Utilizing a #11 blade I was able to make a very small stab incision at the level of the PIPJ and transect the long flexor tendon.  The toe was noted to be straight immediately I then flushed the area with normal sterile saline and then closed with Dermabond.  Dressed a compressive dressing was applied he was provided a Darco shoe he will continue use a Darco shoe for the next few days and not get this wet.  Follow-up with him in a week or so just for reevaluation.

## 2021-04-01 ENCOUNTER — Ambulatory Visit (INDEPENDENT_AMBULATORY_CARE_PROVIDER_SITE_OTHER): Payer: BLUE CROSS/BLUE SHIELD | Admitting: Podiatry

## 2021-04-01 ENCOUNTER — Other Ambulatory Visit: Payer: Self-pay

## 2021-04-01 ENCOUNTER — Encounter: Payer: Self-pay | Admitting: Podiatry

## 2021-04-01 DIAGNOSIS — Z9889 Other specified postprocedural states: Secondary | ICD-10-CM

## 2021-04-01 DIAGNOSIS — M205X9 Other deformities of toe(s) (acquired), unspecified foot: Secondary | ICD-10-CM

## 2021-04-01 NOTE — Progress Notes (Signed)
He presents today for follow-up of his flexor tenotomy second toe right foot.  States that is doing just fine no problems whatsoever.  I think that is doing the job.  Objective: There is no erythema edema cellulitis drainage or odor incision site is gone on to heal uneventfully I see no signs of infection.  Assessment: Well-healing surgical toe.  Plan: He is going to follow-up with [place] on an as-needed basis not only for the second toe but also for chemical matrixectomy's.

## 2021-04-28 ENCOUNTER — Encounter (INDEPENDENT_AMBULATORY_CARE_PROVIDER_SITE_OTHER): Payer: Self-pay

## 2021-05-25 ENCOUNTER — Ambulatory Visit (INDEPENDENT_AMBULATORY_CARE_PROVIDER_SITE_OTHER): Payer: Self-pay | Admitting: Internal Medicine

## 2021-09-23 ENCOUNTER — Ambulatory Visit (INDEPENDENT_AMBULATORY_CARE_PROVIDER_SITE_OTHER): Payer: BLUE CROSS/BLUE SHIELD | Admitting: Internal Medicine

## 2023-03-20 ENCOUNTER — Other Ambulatory Visit: Payer: Self-pay | Admitting: Physical Medicine and Rehabilitation

## 2023-03-20 ENCOUNTER — Other Ambulatory Visit (HOSPITAL_COMMUNITY): Payer: Self-pay | Admitting: Physical Medicine and Rehabilitation

## 2023-03-20 DIAGNOSIS — G894 Chronic pain syndrome: Secondary | ICD-10-CM

## 2023-03-20 DIAGNOSIS — M545 Low back pain, unspecified: Secondary | ICD-10-CM

## 2023-03-20 DIAGNOSIS — M62830 Muscle spasm of back: Secondary | ICD-10-CM

## 2023-03-20 DIAGNOSIS — R269 Unspecified abnormalities of gait and mobility: Secondary | ICD-10-CM

## 2023-03-20 DIAGNOSIS — G8929 Other chronic pain: Secondary | ICD-10-CM

## 2023-03-20 DIAGNOSIS — R2681 Unsteadiness on feet: Secondary | ICD-10-CM

## 2023-03-20 DIAGNOSIS — M546 Pain in thoracic spine: Secondary | ICD-10-CM
